# Patient Record
Sex: Male | Born: 1950 | Race: White | Hispanic: No | Marital: Married | Smoking: Current some day smoker
Health system: Southern US, Community
[De-identification: ages and names within clinical notes are randomized; demographics above are authoritative.]

## PROBLEM LIST (undated history)

## (undated) DIAGNOSIS — I499 Cardiac arrhythmia, unspecified: Secondary | ICD-10-CM

## (undated) DIAGNOSIS — F32A Depression, unspecified: Secondary | ICD-10-CM

## (undated) DIAGNOSIS — Z87442 Personal history of urinary calculi: Secondary | ICD-10-CM

## (undated) DIAGNOSIS — I10 Essential (primary) hypertension: Secondary | ICD-10-CM

## (undated) DIAGNOSIS — F329 Major depressive disorder, single episode, unspecified: Secondary | ICD-10-CM

## (undated) HISTORY — PX: JOINT REPLACEMENT: SHX530

## (undated) HISTORY — PX: TONSILLECTOMY: SUR1361

## (undated) HISTORY — DX: Cardiac arrhythmia, unspecified: I49.9

## (undated) HISTORY — DX: Essential (primary) hypertension: I10

## (undated) HISTORY — PX: HERNIA REPAIR: SHX51

---
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
## 2004-01-10 ENCOUNTER — Ambulatory Visit (HOSPITAL_COMMUNITY): Admission: RE | Admit: 2004-01-10 | Discharge: 2004-01-10 | Payer: Self-pay | Admitting: Gastroenterology

## 2004-01-10 ENCOUNTER — Encounter (INDEPENDENT_AMBULATORY_CARE_PROVIDER_SITE_OTHER): Payer: Self-pay | Admitting: *Deleted

## 2004-05-29 ENCOUNTER — Emergency Department (HOSPITAL_COMMUNITY): Admission: EM | Admit: 2004-05-29 | Discharge: 2004-05-29 | Payer: Self-pay | Admitting: Emergency Medicine

## 2009-04-22 ENCOUNTER — Encounter: Admission: RE | Admit: 2009-04-22 | Discharge: 2009-04-22 | Payer: Self-pay | Admitting: Family Medicine

## 2010-07-31 NOTE — Op Note (Signed)
Daniel Lucero, Daniel Lucero           ACCOUNT NO.:  [id number]   MEDICAL RECORD NO.:  [id number]          PATIENT TYPE:  AMB   LOCATION:  ENDO                         FACILITY:  [facility]   PHYSICIAN:  Anselmo Rod, M.D.  DATE OF BIRTH:  Sep 06, 1950   DATE OF PROCEDURE:  01/10/2004  DATE OF DISCHARGE:                                 OPERATIVE REPORT   PROCEDURE PERFORMED:  Screening colonoscopy.   ENDOSCOPIST:  Anselmo Rod, M.D.   INSTRUMENT USED:  Olympus video colonoscope.   INDICATION FOR PROCEDURE:  A 60 year old white male undergoing a screening  colonoscopy.  Rule out colonic polyps, masses, etc.   PREPROCEDURE PREPARATION:  Informed consent was procured from the patient.  The patient fasted for eight hours prior to the procedure and prepped with a  bottle of magnesium citrate and a gallon of GoLYTELY the night prior to the  procedure.   PREPROCEDURE PHYSICAL:  VITAL SIGNS:  The patient had stable vital signs.  NECK:  Supple.  CHEST:  Clear to auscultation.  S1, S2 regular.  ABDOMEN:  Soft with normal bowel sounds.   DESCRIPTION OF PROCEDURE:  The patient was placed in the left lateral  decubitus position and sedated with 75 mg of Demerol and 7.5 mg of Versed in  slow incremental doses.  Once the patient was adequately sedate and  maintained on low-flow oxygen and continuous cardiac monitoring, the Olympus  video colonoscope was advanced from the rectum to the cecum.  The  appendiceal orifice and the ileocecal valve were clearly visualized and  photographed.  No ulcerations or erosions were seen.  A polypoid lesion was  biopsied at 30 cm.  This was a yellowish-appearing lesion and may very well  be a lipoma.  Sigmoid diverticulosis was noted.  These diverticula were in  the early stages of formation.  Retroflexion in the rectum revealed small  internal hemorrhoids.  The patient tolerated the procedure well without  immediate complications.   IMPRESSION:  1.  Small  internal hemorrhoids.  2.  Polypoid lesion biopsied at 30 cm, question lipoma.  3.  Early sigmoid diverticulosis.  4.  Normal-appearing transverse colon, right colon, and cecum.   RECOMMENDATIONS:  1.  Await pathology results.  2.  Avoid all nonsteroidals, including aspirin, for the next four weeks.  3.  Brochures on diverticulosis given to the patient for his education.  4.  Outpatient follow-up in the next two weeks for further recommendations.       JNM/MEDQ  D:  01/10/2004  T:  01/10/2004  Job:  [id number]   cc:   Teena Irani. Arlyce Dice, M.D.  [address]  [place]  [place] [address]  Fax: [phone]

## 2014-03-11 ENCOUNTER — Other Ambulatory Visit (HOSPITAL_COMMUNITY): Payer: Self-pay | Admitting: Urology

## 2014-03-11 DIAGNOSIS — R972 Elevated prostate specific antigen [PSA]: Secondary | ICD-10-CM

## 2014-03-27 ENCOUNTER — Ambulatory Visit (HOSPITAL_COMMUNITY)
Admission: RE | Admit: 2014-03-27 | Discharge: 2014-03-27 | Disposition: A | Discharge status: Home / Self Care | Payer: 59 | Source: Ambulatory Visit | Attending: Urology | Admitting: Urology

## 2014-03-27 DIAGNOSIS — R972 Elevated prostate specific antigen [PSA]: Secondary | ICD-10-CM | POA: Diagnosis not present

## 2014-03-27 LAB — POCT I-STAT CREATININE: CREATININE: 1.1 mg/dL (ref 0.50–1.35)

## 2014-03-27 MED ORDER — GADOBENATE DIMEGLUMINE 529 MG/ML IV SOLN
20.0000 mL | Freq: Once | INTRAVENOUS | Status: AC | PRN
  Administered 2014-03-27: 17 mL via INTRAVENOUS

## 2015-05-21 DIAGNOSIS — Z87891 Personal history of nicotine dependence: Secondary | ICD-10-CM | POA: Insufficient documentation

## 2015-05-21 DIAGNOSIS — R972 Elevated prostate specific antigen [PSA]: Secondary | ICD-10-CM | POA: Insufficient documentation

## 2015-05-21 DIAGNOSIS — K5790 Diverticulosis of intestine, part unspecified, without perforation or abscess without bleeding: Secondary | ICD-10-CM | POA: Insufficient documentation

## 2015-05-21 DIAGNOSIS — F339 Major depressive disorder, recurrent, unspecified: Secondary | ICD-10-CM | POA: Insufficient documentation

## 2015-05-21 DIAGNOSIS — K5900 Constipation, unspecified: Secondary | ICD-10-CM | POA: Insufficient documentation

## 2015-05-21 DIAGNOSIS — M5412 Radiculopathy, cervical region: Secondary | ICD-10-CM | POA: Insufficient documentation

## 2015-05-21 DIAGNOSIS — F39 Unspecified mood [affective] disorder: Secondary | ICD-10-CM

## 2015-05-21 DIAGNOSIS — N400 Enlarged prostate without lower urinary tract symptoms: Secondary | ICD-10-CM | POA: Insufficient documentation

## 2015-05-21 DIAGNOSIS — M2540 Effusion, unspecified joint: Secondary | ICD-10-CM | POA: Insufficient documentation

## 2015-05-21 DIAGNOSIS — F3181 Bipolar II disorder: Secondary | ICD-10-CM | POA: Insufficient documentation

## 2015-05-21 DIAGNOSIS — F340 Cyclothymic disorder: Secondary | ICD-10-CM | POA: Insufficient documentation

## 2015-05-21 DIAGNOSIS — I499 Cardiac arrhythmia, unspecified: Secondary | ICD-10-CM | POA: Insufficient documentation

## 2015-09-09 DIAGNOSIS — M25531 Pain in right wrist: Secondary | ICD-10-CM | POA: Insufficient documentation

## 2015-09-09 DIAGNOSIS — M25532 Pain in left wrist: Secondary | ICD-10-CM

## 2016-12-03 ENCOUNTER — Other Ambulatory Visit: Payer: Self-pay | Admitting: Family Medicine

## 2016-12-03 ENCOUNTER — Ambulatory Visit
Admission: RE | Admit: 2016-12-03 | Discharge: 2016-12-03 | Disposition: A | Discharge status: Home / Self Care | Payer: 59 | Source: Ambulatory Visit | Attending: Family Medicine | Admitting: Family Medicine

## 2016-12-03 DIAGNOSIS — M5441 Lumbago with sciatica, right side: Secondary | ICD-10-CM

## 2016-12-03 DIAGNOSIS — M5442 Lumbago with sciatica, left side: Principal | ICD-10-CM

## 2016-12-15 DIAGNOSIS — M150 Primary generalized (osteo)arthritis: Secondary | ICD-10-CM

## 2016-12-15 DIAGNOSIS — M159 Polyosteoarthritis, unspecified: Secondary | ICD-10-CM | POA: Insufficient documentation

## 2016-12-15 DIAGNOSIS — Z9189 Other specified personal risk factors, not elsewhere classified: Secondary | ICD-10-CM | POA: Insufficient documentation

## 2017-05-05 DIAGNOSIS — G8929 Other chronic pain: Secondary | ICD-10-CM | POA: Insufficient documentation

## 2017-05-05 DIAGNOSIS — C4441 Basal cell carcinoma of skin of scalp and neck: Secondary | ICD-10-CM | POA: Insufficient documentation

## 2017-05-05 DIAGNOSIS — M25512 Pain in left shoulder: Secondary | ICD-10-CM

## 2017-09-27 ENCOUNTER — Other Ambulatory Visit: Payer: Self-pay | Admitting: Orthopedic Surgery

## 2017-09-27 DIAGNOSIS — M542 Cervicalgia: Secondary | ICD-10-CM

## 2017-09-27 DIAGNOSIS — M25512 Pain in left shoulder: Secondary | ICD-10-CM

## 2017-10-07 ENCOUNTER — Ambulatory Visit
Admission: RE | Admit: 2017-10-07 | Discharge: 2017-10-07 | Disposition: A | Discharge status: Home / Self Care | Payer: 59 | Source: Ambulatory Visit | Attending: Orthopedic Surgery | Admitting: Orthopedic Surgery

## 2017-10-07 DIAGNOSIS — M542 Cervicalgia: Secondary | ICD-10-CM

## 2017-10-07 DIAGNOSIS — M25512 Pain in left shoulder: Secondary | ICD-10-CM

## 2017-10-18 DIAGNOSIS — M5412 Radiculopathy, cervical region: Secondary | ICD-10-CM | POA: Insufficient documentation

## 2017-10-22 ENCOUNTER — Other Ambulatory Visit: Payer: Self-pay

## 2017-10-22 ENCOUNTER — Encounter (HOSPITAL_COMMUNITY): Payer: Self-pay

## 2017-10-22 ENCOUNTER — Emergency Department (HOSPITAL_COMMUNITY): Payer: 59

## 2017-10-22 ENCOUNTER — Emergency Department (HOSPITAL_COMMUNITY)
Admission: EM | Admit: 2017-10-22 | Discharge: 2017-10-22 | Disposition: A | Discharge status: Home / Self Care | Payer: 59 | Attending: Emergency Medicine | Admitting: Emergency Medicine

## 2017-10-22 DIAGNOSIS — Y999 Unspecified external cause status: Secondary | ICD-10-CM | POA: Diagnosis not present

## 2017-10-22 DIAGNOSIS — S161XXA Strain of muscle, fascia and tendon at neck level, initial encounter: Secondary | ICD-10-CM | POA: Diagnosis not present

## 2017-10-22 DIAGNOSIS — Y939 Activity, unspecified: Secondary | ICD-10-CM | POA: Diagnosis not present

## 2017-10-22 DIAGNOSIS — Z79899 Other long term (current) drug therapy: Secondary | ICD-10-CM | POA: Diagnosis not present

## 2017-10-22 DIAGNOSIS — S199XXA Unspecified injury of neck, initial encounter: Secondary | ICD-10-CM | POA: Diagnosis present

## 2017-10-22 DIAGNOSIS — Z7982 Long term (current) use of aspirin: Secondary | ICD-10-CM | POA: Insufficient documentation

## 2017-10-22 DIAGNOSIS — Z87891 Personal history of nicotine dependence: Secondary | ICD-10-CM | POA: Insufficient documentation

## 2017-10-22 DIAGNOSIS — Y92410 Unspecified street and highway as the place of occurrence of the external cause: Secondary | ICD-10-CM | POA: Insufficient documentation

## 2017-10-22 MED ORDER — CYCLOBENZAPRINE HCL 10 MG PO TABS: 10.0000 mg | ORAL_TABLET | Freq: Two times a day (BID) | ORAL | 0 refills | Status: DC | PRN
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
## 2017-10-22 NOTE — ED Notes (Signed)
Patient transported to CT 

## 2017-10-22 NOTE — ED Notes (Signed)
MD at bedside. 

## 2017-10-22 NOTE — ED Notes (Signed)
Pt updated on plan of care

## 2017-10-22 NOTE — ED Notes (Signed)
Per Ralene Bathe MD: C-collar may be removed.
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
## 2017-11-17 DIAGNOSIS — M25561 Pain in right knee: Secondary | ICD-10-CM | POA: Insufficient documentation

## 2017-12-16 DIAGNOSIS — M542 Cervicalgia: Secondary | ICD-10-CM | POA: Insufficient documentation

## 2018-01-12 DIAGNOSIS — M5030 Other cervical disc degeneration, unspecified cervical region: Secondary | ICD-10-CM | POA: Insufficient documentation

## 2018-02-27 ENCOUNTER — Other Ambulatory Visit: Payer: Self-pay

## 2018-02-27 ENCOUNTER — Ambulatory Visit (INDEPENDENT_AMBULATORY_CARE_PROVIDER_SITE_OTHER): Payer: 59 | Admitting: Family Medicine

## 2018-02-27 ENCOUNTER — Encounter: Payer: Self-pay | Admitting: Family Medicine

## 2018-02-27 VITALS — BP 140/96 | HR 51 | Temp 98.4°F | Resp 17 | Ht 69.0 in | Wt 179.2 lb

## 2018-02-27 DIAGNOSIS — R6889 Other general symptoms and signs: Secondary | ICD-10-CM

## 2018-02-27 DIAGNOSIS — I10 Essential (primary) hypertension: Secondary | ICD-10-CM | POA: Diagnosis not present

## 2018-02-27 MED ORDER — CETIRIZINE HCL 10 MG PO TABS: 10.0000 mg | ORAL_TABLET | Freq: Every day | ORAL | 11 refills | Status: DC

## 2018-02-27 MED ORDER — OLOPATADINE HCL 0.2 % OP SOLN: 1.0000 [drp] | Freq: Every day | OPHTHALMIC | 0 refills | Status: DC

## 2018-02-27 NOTE — Patient Instructions (Addendum)
° ° ° °  If you have lab work done today you will be contacted with your lab results within the next 2 weeks.  If you have not heard from us then please contact us. The fastest way to get your results is to register for My Chart. ° ° °IF you received an x-ray today, you will receive an invoice from [facility]. Please contact [facility] at [phone] with questions or concerns regarding your invoice.  ° °IF you received labwork today, you will receive an invoice from [facility]. Please contact [facility] at [phone] with questions or concerns regarding your invoice.  ° °Our billing staff will not be able to assist you with questions regarding bills from these companies. ° °You will be contacted with the lab results as soon as they are available. The fastest way to get your results is to activate your My Chart account. Instructions are located on the last page of this paperwork. If you have not heard from us regarding the results in 2 weeks, please contact this office. °  ° ° ° °

## 2018-02-27 NOTE — Progress Notes (Signed)
New Patient Office Visit  Subjective:  Patient ID: Daniel Lucero, male    DOB: March 23, 1950  Age: 67 y.o. MRN: [id number]  CC:  Chief Complaint  Patient presents with  . itchy eyes x Saturday night, some swelling and very very itc    tried cvs eye allergy relief and helps some and has used a couple times in the left eye    HPI Daniel Lucero [facility] presents for   Itching of the eyes that started late Saturday night at a family gathering and started with the left eye and is now itching in the right eye He states that he used some cvs otc eye drops which burned a little and did not help No drainage from the eye States that the eye is tender to the touch He states that his vision seems slightly blurry due to watery eyes He took a gabapentin related to his neck pain and sees [facility]  Hypertension He states that he always has high blood pressure at the doctors office He states it is related to nerves BP Readings from Last 3 Encounters:  02/27/18 (!) 140/96  10/22/17 (!) 163/92   He reports that he eats well and smokes an occasional cigar Denies nsaid use   No past medical history on file.  Past Surgical History:  Procedure Laterality Date  . HERNIA REPAIR    . TONSILLECTOMY      Family History  Problem Relation Age of Onset  . Alzheimer's disease Mother   . Hypertension Father   . Heart disease Father     Social History   Socioeconomic History  . Marital status: Married    Spouse name: Not on file  . Number of children: Not on file  . Years of education: Not on file  . Highest education level: Not on file  Occupational History  . Not on file  Social Needs  . Financial resource strain: Not on file  . Food insecurity:    Worry: Not on file    Inability: Not on file  . Transportation needs:    Medical: Not on file    Non-medical: Not on file  Tobacco Use  . Smoking status: Former Smoker    Types: Cigarettes  . Smokeless tobacco: Never Used    Substance and Sexual Activity  . Alcohol use: Yes    Alcohol/week: 5.0 standard drinks    Types: 5 Cans of beer per week  . Drug use: Never  . Sexual activity: Not on file  Lifestyle  . Physical activity:    Days per week: Not on file    Minutes per session: Not on file  . Stress: Not on file  Relationships  . Social connections:    Talks on phone: Not on file    Gets together: Not on file    Attends religious service: Not on file    Active member of club or organization: Not on file    Attends meetings of clubs or organizations: Not on file    Relationship status: Not on file  . Intimate partner violence:    Fear of current or ex partner: Not on file    Emotionally abused: Not on file    Physically abused: Not on file    Forced sexual activity: Not on file  Other Topics Concern  . Not on file  Social History Narrative  . Not on file    ROS Review of Systems Review of Systems  Constitutional: Negative for  activity change, appetite change, chills and fever.  HENT: Negative for congestion, nosebleeds, trouble swallowing and voice change.   Respiratory: Negative for cough, shortness of breath and wheezing.   Gastrointestinal: Negative for diarrhea, nausea and vomiting.  Genitourinary: Negative for difficulty urinating, dysuria, flank pain and hematuria.  Musculoskeletal: Negative for back pain, joint swelling and neck pain.  Neurological: Negative for dizziness, speech difficulty, light-headedness and numbness.  See HPI. All other review of systems negative.   Objective:   Today's Vitals: BP (!) 140/96   Pulse (!) 51   Temp 98.4 F (36.9 C) (Oral)   Resp 17   Ht 5\' 9"  (1.753 m)   Wt 179 lb 3.2 oz (81.3 kg)   SpO2 100%   BMI 26.46 kg/m   Physical Exam  General: alert, oriented, in NAD Head: normocephalic, atraumatic, no sinus tenderness Eyes: EOM intact, no scleral icterus or conjunctival injection Ears: TM clear bilaterally Nose: mucosa nonerythematous,  nonedematous Throat: no pharyngeal exudate or erythema Lymph: no posterior auricular, submental or cervical lymph adenopathy Heart: normal rate, normal sinus rhythm, no murmurs Lungs: clear to auscultation bilaterally, no wheezing   Assessment & Plan:   Problem List Items Addressed This Visit    None    Visit Diagnoses    Itchy eyes    -  Primary -  Likely allergic No conjunctivitis    Relevant Medications   Olopatadine HCl (PATADAY) 0.2 % SOLN   Essential hypertension     -  Newly diagnosed -  Discussed DASH diet -  Advised pt to follow up in 3 months for full physical and labs      Outpatient Encounter Medications as of 02/27/2018  Medication Sig  . gabapentin (NEURONTIN) 100 MG capsule Take 100 mg by mouth 3 (three) times a week.  . tamsulosin (FLOMAX) 0.4 MG CAPS capsule Take 0.4 mg by mouth at bedtime.  Marland Kitchen aspirin 325 MG EC tablet Take 325 mg by mouth daily as needed (headache).   . cetirizine (ZYRTEC) 10 MG tablet Take 1 tablet (10 mg total) by mouth daily.  . cyclobenzaprine (FLEXERIL) 10 MG tablet Take 1 tablet (10 mg total) by mouth 2 (two) times daily as needed for muscle spasms. (Patient not taking: Reported on 02/27/2018)  . Olopatadine HCl (PATADAY) 0.2 % SOLN Apply 1 drop to eye daily.   No facility-administered encounter medications on file as of 02/27/2018.     Follow-up: Return in about 3 months (around 05/29/2018) for physical exam .   Forrest Moron, MD

## 2018-03-16 DIAGNOSIS — M542 Cervicalgia: Secondary | ICD-10-CM | POA: Diagnosis not present

## 2018-03-20 DIAGNOSIS — M25562 Pain in left knee: Secondary | ICD-10-CM | POA: Diagnosis not present

## 2018-03-20 DIAGNOSIS — M25561 Pain in right knee: Secondary | ICD-10-CM | POA: Diagnosis not present

## 2018-03-24 DIAGNOSIS — M542 Cervicalgia: Secondary | ICD-10-CM | POA: Diagnosis not present

## 2018-03-28 DIAGNOSIS — M5030 Other cervical disc degeneration, unspecified cervical region: Secondary | ICD-10-CM | POA: Diagnosis not present

## 2018-03-31 DIAGNOSIS — M542 Cervicalgia: Secondary | ICD-10-CM | POA: Diagnosis not present

## 2018-04-03 DIAGNOSIS — M5030 Other cervical disc degeneration, unspecified cervical region: Secondary | ICD-10-CM | POA: Diagnosis not present

## 2018-04-03 DIAGNOSIS — M542 Cervicalgia: Secondary | ICD-10-CM | POA: Diagnosis not present

## 2018-04-13 ENCOUNTER — Other Ambulatory Visit: Payer: Self-pay

## 2018-04-13 ENCOUNTER — Encounter: Payer: Self-pay | Admitting: Family Medicine

## 2018-04-13 ENCOUNTER — Ambulatory Visit: Payer: BLUE CROSS/BLUE SHIELD | Admitting: Family Medicine

## 2018-04-13 VITALS — BP 140/90 | HR 60 | Temp 98.6°F | Ht 69.0 in | Wt 178.0 lb

## 2018-04-13 DIAGNOSIS — R21 Rash and other nonspecific skin eruption: Secondary | ICD-10-CM

## 2018-04-13 DIAGNOSIS — M542 Cervicalgia: Secondary | ICD-10-CM | POA: Diagnosis not present

## 2018-04-13 MED ORDER — TRIAMCINOLONE ACETONIDE 0.1 % EX CREA: 1.0000 "application " | TOPICAL_CREAM | Freq: Two times a day (BID) | CUTANEOUS | 0 refills | Status: DC

## 2018-04-13 MED ORDER — HYDROXYZINE HCL 25 MG PO TABS: 25.0000 mg | ORAL_TABLET | Freq: Every evening | ORAL | 0 refills | Status: DC | PRN

## 2018-04-13 NOTE — Patient Instructions (Signed)
° ° ° °  If you have lab work done today you will be contacted with your lab results within the next 2 weeks.  If you have not heard from us then please contact us. The fastest way to get your results is to register for My Chart. ° ° °IF you received an x-ray today, you will receive an invoice from [facility]. Please contact [facility] at [phone] with questions or concerns regarding your invoice.  ° °IF you received labwork today, you will receive an invoice from [facility]. Please contact [facility] at [phone] with questions or concerns regarding your invoice.  ° °Our billing staff will not be able to assist you with questions regarding bills from these companies. ° °You will be contacted with the lab results as soon as they are available. The fastest way to get your results is to activate your My Chart account. Instructions are located on the last page of this paperwork. If you have not heard from us regarding the results in 2 weeks, please contact this office. °  ° ° ° °
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
## 2018-04-25 DIAGNOSIS — M5030 Other cervical disc degeneration, unspecified cervical region: Secondary | ICD-10-CM | POA: Diagnosis not present

## 2018-04-28 DIAGNOSIS — R351 Nocturia: Secondary | ICD-10-CM | POA: Diagnosis not present

## 2018-04-28 DIAGNOSIS — N401 Enlarged prostate with lower urinary tract symptoms: Secondary | ICD-10-CM | POA: Diagnosis not present

## 2018-05-01 DIAGNOSIS — L570 Actinic keratosis: Secondary | ICD-10-CM | POA: Diagnosis not present

## 2018-05-01 DIAGNOSIS — D229 Melanocytic nevi, unspecified: Secondary | ICD-10-CM | POA: Diagnosis not present

## 2018-05-01 DIAGNOSIS — M542 Cervicalgia: Secondary | ICD-10-CM | POA: Diagnosis not present

## 2018-05-01 DIAGNOSIS — L299 Pruritus, unspecified: Secondary | ICD-10-CM | POA: Diagnosis not present

## 2018-05-16 DIAGNOSIS — M542 Cervicalgia: Secondary | ICD-10-CM | POA: Diagnosis not present

## 2018-05-16 DIAGNOSIS — R972 Elevated prostate specific antigen [PSA]: Secondary | ICD-10-CM | POA: Diagnosis not present

## 2018-05-16 DIAGNOSIS — N401 Enlarged prostate with lower urinary tract symptoms: Secondary | ICD-10-CM | POA: Diagnosis not present

## 2018-05-16 DIAGNOSIS — R351 Nocturia: Secondary | ICD-10-CM | POA: Diagnosis not present

## 2018-05-16 DIAGNOSIS — N5201 Erectile dysfunction due to arterial insufficiency: Secondary | ICD-10-CM | POA: Diagnosis not present

## 2018-06-03 ENCOUNTER — Other Ambulatory Visit: Payer: Self-pay | Admitting: *Deleted

## 2018-06-03 DIAGNOSIS — Z0000 Encounter for general adult medical examination without abnormal findings: Secondary | ICD-10-CM

## 2018-06-07 ENCOUNTER — Encounter: Payer: 59 | Admitting: Family Medicine

## 2018-07-13 ENCOUNTER — Ambulatory Visit: Payer: BLUE CROSS/BLUE SHIELD | Admitting: Family Medicine

## 2018-08-14 ENCOUNTER — Ambulatory Visit: Payer: BLUE CROSS/BLUE SHIELD | Admitting: Family Medicine

## 2018-09-25 DIAGNOSIS — Z1210 Encounter for screening for malignant neoplasm of intestinal tract, unspecified: Secondary | ICD-10-CM | POA: Diagnosis not present

## 2018-09-28 ENCOUNTER — Encounter: Payer: BLUE CROSS/BLUE SHIELD | Admitting: Family Medicine

## 2018-09-29 ENCOUNTER — Encounter: Payer: BLUE CROSS/BLUE SHIELD | Admitting: Family Medicine

## 2018-10-05 DIAGNOSIS — M5030 Other cervical disc degeneration, unspecified cervical region: Secondary | ICD-10-CM | POA: Diagnosis not present

## 2018-10-05 DIAGNOSIS — M25512 Pain in left shoulder: Secondary | ICD-10-CM | POA: Diagnosis not present

## 2018-10-23 DIAGNOSIS — N401 Enlarged prostate with lower urinary tract symptoms: Secondary | ICD-10-CM | POA: Diagnosis not present

## 2018-10-23 DIAGNOSIS — R351 Nocturia: Secondary | ICD-10-CM | POA: Diagnosis not present

## 2018-10-23 DIAGNOSIS — M542 Cervicalgia: Secondary | ICD-10-CM | POA: Diagnosis not present

## 2018-10-23 DIAGNOSIS — M25512 Pain in left shoulder: Secondary | ICD-10-CM | POA: Diagnosis not present

## 2018-10-31 DIAGNOSIS — M19012 Primary osteoarthritis, left shoulder: Secondary | ICD-10-CM | POA: Diagnosis not present

## 2018-10-31 DIAGNOSIS — R351 Nocturia: Secondary | ICD-10-CM | POA: Diagnosis not present

## 2018-10-31 DIAGNOSIS — R972 Elevated prostate specific antigen [PSA]: Secondary | ICD-10-CM | POA: Diagnosis not present

## 2018-10-31 DIAGNOSIS — M75102 Unspecified rotator cuff tear or rupture of left shoulder, not specified as traumatic: Secondary | ICD-10-CM | POA: Diagnosis not present

## 2018-10-31 DIAGNOSIS — N401 Enlarged prostate with lower urinary tract symptoms: Secondary | ICD-10-CM | POA: Diagnosis not present

## 2018-11-09 DIAGNOSIS — M13812 Other specified arthritis, left shoulder: Secondary | ICD-10-CM | POA: Diagnosis not present

## 2018-11-13 DIAGNOSIS — M542 Cervicalgia: Secondary | ICD-10-CM | POA: Diagnosis not present

## 2018-11-14 DIAGNOSIS — M5030 Other cervical disc degeneration, unspecified cervical region: Secondary | ICD-10-CM | POA: Diagnosis not present

## 2018-12-04 ENCOUNTER — Ambulatory Visit (INDEPENDENT_AMBULATORY_CARE_PROVIDER_SITE_OTHER): Payer: BC Managed Care – PPO | Admitting: Family Medicine

## 2018-12-04 ENCOUNTER — Other Ambulatory Visit: Payer: Self-pay

## 2018-12-04 DIAGNOSIS — Z23 Encounter for immunization: Secondary | ICD-10-CM | POA: Diagnosis not present

## 2018-12-04 NOTE — Patient Instructions (Signed)
Injection given, tolerated well.

## 2018-12-08 DIAGNOSIS — M13812 Other specified arthritis, left shoulder: Secondary | ICD-10-CM | POA: Diagnosis not present

## 2018-12-13 ENCOUNTER — Other Ambulatory Visit: Payer: Self-pay | Admitting: Orthopedic Surgery

## 2018-12-13 DIAGNOSIS — M13812 Other specified arthritis, left shoulder: Secondary | ICD-10-CM

## 2018-12-29 ENCOUNTER — Ambulatory Visit
Admission: RE | Admit: 2018-12-29 | Discharge: 2018-12-29 | Disposition: A | Discharge status: Home / Self Care | Payer: BC Managed Care – PPO | Source: Ambulatory Visit | Attending: Orthopedic Surgery | Admitting: Orthopedic Surgery

## 2018-12-29 DIAGNOSIS — M13812 Other specified arthritis, left shoulder: Secondary | ICD-10-CM

## 2018-12-29 DIAGNOSIS — M19012 Primary osteoarthritis, left shoulder: Secondary | ICD-10-CM | POA: Diagnosis not present

## 2018-12-29 DIAGNOSIS — Z01818 Encounter for other preprocedural examination: Secondary | ICD-10-CM | POA: Diagnosis not present

## 2019-01-23 ENCOUNTER — Encounter: Payer: Self-pay | Admitting: Emergency Medicine

## 2019-01-23 ENCOUNTER — Ambulatory Visit (INDEPENDENT_AMBULATORY_CARE_PROVIDER_SITE_OTHER): Payer: BC Managed Care – PPO | Admitting: Emergency Medicine

## 2019-01-23 ENCOUNTER — Other Ambulatory Visit: Payer: Self-pay

## 2019-01-23 ENCOUNTER — Ambulatory Visit (INDEPENDENT_AMBULATORY_CARE_PROVIDER_SITE_OTHER): Payer: BC Managed Care – PPO

## 2019-01-23 VITALS — BP 161/92 | HR 68 | Temp 97.9°F | Resp 16 | Ht 69.0 in | Wt 179.0 lb

## 2019-01-23 DIAGNOSIS — S67192A Crushing injury of right middle finger, initial encounter: Secondary | ICD-10-CM

## 2019-01-23 DIAGNOSIS — M79644 Pain in right finger(s): Secondary | ICD-10-CM | POA: Diagnosis not present

## 2019-01-23 DIAGNOSIS — S60131A Contusion of right middle finger with damage to nail, initial encounter: Secondary | ICD-10-CM | POA: Diagnosis not present

## 2019-01-23 DIAGNOSIS — S6710XA Crushing injury of unspecified finger(s), initial encounter: Secondary | ICD-10-CM

## 2019-01-23 DIAGNOSIS — S6010XA Contusion of unspecified finger with damage to nail, initial encounter: Secondary | ICD-10-CM

## 2019-01-23 NOTE — Progress Notes (Signed)
Daniel Lucero 68 y.o.   Chief Complaint  Patient presents with  . Finger Injury    RIGHT 3rd on Saturday - smashed it    HISTORY OF PRESENT ILLNESS: This is a 68 y.o. male status post crushing injury on his right middle finger 3 days ago complaining of throbbing pain.  HPI   Prior to Admission medications   Medication Sig Start Date End Date Taking? Authorizing Provider  aspirin 325 MG EC tablet Take 325 mg by mouth daily as needed (headache).    Yes [provider]  tamsulosin (FLOMAX) 0.4 MG CAPS capsule Take 0.4 mg by mouth at bedtime.   Yes [provider]  gabapentin (NEURONTIN) 100 MG capsule Take 100 mg by mouth 3 (three) times a week.    [provider]  hydrOXYzine (ATARAX/VISTARIL) 25 MG tablet Take 1-2 tablets (25-50 mg total) by mouth at bedtime as needed. Patient not taking: Reported on 01/23/2019 04/13/18   Rutherford Guys, MD  triamcinolone cream (KENALOG) 0.1 % Apply 1 application topically 2 (two) times daily. Patient not taking: Reported on 01/23/2019 04/13/18   Rutherford Guys, MD    Allergies  Allergen Reactions  . Penicillins Rash    Has patient had a PCN reaction causing immediate rash, facial/tongue/throat swelling, SOB or lightheadedness with hypotension: Yes Has patient had a PCN reaction causing severe rash involving mucus membranes or skin necrosis: No Has patient had a PCN reaction that required hospitalization: No Has patient had a PCN reaction occurring within the last 10 years: No If all of the above answers are "NO", then may proceed with Cephalosporin use.    Patient Active Problem List   Diagnosis Date Noted  . DDD (degenerative disc disease), cervical 01/12/2018  . Pain in right knee 11/17/2017  . Basal cell carcinoma (BCC) of left side of neck 05/05/2017  . Chronic left shoulder pain 05/05/2017  . 10 year risk of MI or stroke 7.5% or greater 12/15/2016  . Primary osteoarthritis involving multiple joints  12/15/2016  . Affective personality disorder 05/21/2015  . Benign enlargement of prostate 05/21/2015  . Bipolar II disorder ([place]) 05/21/2015  . Cardiac dysrhythmia 05/21/2015  . Constipation 05/21/2015  . Diverticulosis 05/21/2015  . Elevated PSA 05/21/2015  . History of tobacco use 05/21/2015  . Recurrent major depressive disorder ([place]) 05/21/2015    History reviewed. No pertinent past medical history.  Past Surgical History:  Procedure Laterality Date  . HERNIA REPAIR    . TONSILLECTOMY      Social History   Socioeconomic History  . Marital status: Married    Spouse name: Not on file  . Number of children: Not on file  . Years of education: Not on file  . Highest education level: Not on file  Occupational History  . Not on file  Social Needs  . Financial resource strain: Not on file  . Food insecurity    Worry: Not on file    Inability: Not on file  . Transportation needs    Medical: Not on file    Non-medical: Not on file  Tobacco Use  . Smoking status: Former Smoker    Types: Cigarettes  . Smokeless tobacco: Never Used  Substance and Sexual Activity  . Alcohol use: Yes    Alcohol/week: 5.0 standard drinks    Types: 5 Cans of beer per week  . Drug use: Never  . Sexual activity: Not on file  Lifestyle  . Physical activity  Days per week: Not on file    Minutes per session: Not on file  . Stress: Not on file  Relationships  . Social Herbalist on phone: Not on file    Gets together: Not on file    Attends religious service: Not on file    Active member of club or organization: Not on file    Attends meetings of clubs or organizations: Not on file    Relationship status: Not on file  . Intimate partner violence    Fear of current or ex partner: Not on file    Emotionally abused: Not on file    Physically abused: Not on file    Forced sexual activity: Not on file  Other Topics Concern  . Not on file  Social History Narrative  . Not on  file    Family History  Problem Relation Age of Onset  . Alzheimer's disease Mother   . Hypertension Father   . Heart disease Father      Review of Systems  Constitutional: Negative.  Negative for chills and fever.  HENT: Negative.  Negative for congestion and sore throat.   Respiratory: Negative.  Negative for cough and shortness of breath.   Cardiovascular: Negative.  Negative for chest pain and palpitations.  Gastrointestinal: Negative for abdominal pain, nausea and vomiting.  Genitourinary: Negative.   Musculoskeletal: Negative.  Negative for myalgias.  Skin: Negative.   Neurological: Negative.  Negative for dizziness and headaches.  All other systems reviewed and are negative.  Vitals:   01/23/19 1026  BP: (!) 161/92  Pulse: 68  Resp: 16  Temp: 97.9 F (36.6 C)  SpO2: 94%     Physical Exam Vitals signs reviewed.  Constitutional:      Appearance: Normal appearance.  HENT:     Head: Normocephalic.  Eyes:     Extraocular Movements: Extraocular movements intact.  Neck:     Musculoskeletal: Normal range of motion.  Cardiovascular:     Rate and Rhythm: Normal rate.  Pulmonary:     Effort: Pulmonary effort is normal.  Skin:    General: Skin is warm and dry.     Capillary Refill: Capillary refill takes less than 2 seconds.     Comments: Right middle finger: Positive subungual hematoma.  Positive swelling, erythema, and tenderness to distal finger.  Neurovascularly intact.  Neurological:     General: No focal deficit present.     Mental Status: He is alert and oriented to person, place, and time.  Psychiatric:        Mood and Affect: Mood normal.        Behavior: Behavior normal.    Dg Finger Middle Right  Result Date: 01/23/2019 CLINICAL DATA:  Crush injury EXAM: RIGHT MIDDLE FINGER 2+V COMPARISON:  None. FINDINGS: There is no evidence of fracture or dislocation. Joint spaces are preserved. No radiopaque foreign body. IMPRESSION: No acute fracture.  Electronically Signed   By: Macy Mis M.D.   On: 01/23/2019 10:53     ASSESSMENT & PLAN: Daniel Lucero was seen today for finger injury.  Diagnoses and all orders for this visit:  Crushing injury of finger, initial encounter -     DG Finger Middle Right; Future  Pain of finger of right hand  Subungual hematoma of digit of hand, initial encounter    Patient Instructions       If you have lab work done today you will be contacted with your lab results within  the next 2 weeks.  If you have not heard from [place] then please contact us. The fastest way to get your results is to register for My Chart.   IF you received an x-ray today, you will receive an invoice from [facility]. Please contact [facility] at [phone] with questions or concerns regarding your invoice.   IF you received labwork today, you will receive an invoice from [facility]. Please contact [facility] at [phone] with questions or concerns regarding your invoice.   Our billing staff will not be able to assist you with questions regarding bills from these companies.  You will be contacted with the lab results as soon as they are available. The fastest way to get your results is to activate your My Chart account. Instructions are located on the last page of this paperwork. If you have not heard from [place] regarding the results in 2 weeks, please contact this office.     Subungual Hematoma A subungual hematoma is a collection of blood under a fingernail or toenail. It can cause pain and a blue area under the nail. What are the causes? This condition is caused by an injury to a finger or toe that breaks a blood vessel under the nail. It can result from:  A hard, direct hit to a finger or toe (crush injury).  Pressure being put on a finger or toe over and over again, such as pressure on a toe from running. What are the signs or symptoms?   A blue or dark blue color under the nail.  Pain or  throbbing in the injured area. How is this treated? Treatment is often not needed for this condition. The pain often goes away in a few days, and the dark color under the nail will go away as the nail grows. If treatment is needed, your doctor may:  Do a procedure to drain the blood from under the nail. This may be done if you have a lot of pain or if a lot of blood collects under the nail.  Remove the nail. This may be done if there is a cut under the nail that needs stitches (sutures). Follow these instructions at home: Managing pain, stiffness, and swelling   If told, put ice on the area. ? Put ice in a plastic bag. ? Place a towel between your skin and the bag. ? Leave the ice on for 20 minutes, 2-3 times a day.  Raise (elevate) the injured finger or toe above the level of your heart while you are sitting or lying down. Doing this will help with pain and swelling. Injury care  Follow instructions from your doctor about how to take care of your injury. Make sure you: ? Change any bandage (dressing) as told by your doctor. ? Wash your hands with soap and water before you change your bandage. If you cannot use soap and water, use hand sanitizer. ? Leave stitches (sutures) in place. You may have these if your doctor fixed a cut under the nail. The stitches may need to stay in place for 2 weeks or longer.  If part of your nail falls off, gently trim the rest of the nail. General instructions  Take over-the-counter and prescription medicines only as told by your doctor.  Return to your normal activities as told by your doctor. Ask your doctor what activities are safe for you.  Keep all follow-up visits as told by your doctor. This is important. Contact a doctor if you have:  Pain that is not helped by medicine.  A fever.  Redness, swelling, or pain around your nail. Get help right away if you have:  Fluid, blood, or pus coming from your nail. Summary  A subungual hematoma  is a collection of blood under a fingernail or toenail.  It can cause pain and a blue area under the nail.  Treatment is often not needed for this condition.  Raise the injured finger or toe above the level of your heart while you are sitting or lying down. This information is not intended to replace advice given to you by your health care provider. Make sure you discuss any questions you have with your health care provider. Document Released: 05/24/2011 Document Revised: 08/04/2017 Document Reviewed: 08/04/2017 Elsevier Patient Education  2020 Elsevier Inc.      Agustina Caroli, MD [facility]

## 2019-01-23 NOTE — Patient Instructions (Addendum)
If you have lab work done today you will be contacted with your lab results within the next 2 weeks.  If you have not heard from [place] then please contact us. The fastest way to get your results is to register for My Chart.   IF you received an x-ray today, you will receive an invoice from [facility]. Please contact [facility] at [phone] with questions or concerns regarding your invoice.   IF you received labwork today, you will receive an invoice from [facility]. Please contact [facility] at [phone] with questions or concerns regarding your invoice.   Our billing staff will not be able to assist you with questions regarding bills from these companies.  You will be contacted with the lab results as soon as they are available. The fastest way to get your results is to activate your My Chart account. Instructions are located on the last page of this paperwork. If you have not heard from [place] regarding the results in 2 weeks, please contact this office.     Subungual Hematoma A subungual hematoma is a collection of blood under a fingernail or toenail. It can cause pain and a blue area under the nail. What are the causes? This condition is caused by an injury to a finger or toe that breaks a blood vessel under the nail. It can result from:  A hard, direct hit to a finger or toe (crush injury).  Pressure being put on a finger or toe over and over again, such as pressure on a toe from running. What are the signs or symptoms?   A blue or dark blue color under the nail.  Pain or throbbing in the injured area. How is this treated? Treatment is often not needed for this condition. The pain often goes away in a few days, and the dark color under the nail will go away as the nail grows. If treatment is needed, your doctor may:  Do a procedure to drain the blood from under the nail. This may be done if you have a lot of pain or if a lot of blood collects under the  nail.  Remove the nail. This may be done if there is a cut under the nail that needs stitches (sutures). Follow these instructions at home: Managing pain, stiffness, and swelling   If told, put ice on the area. ? Put ice in a plastic bag. ? Place a towel between your skin and the bag. ? Leave the ice on for 20 minutes, 2-3 times a day.  Raise (elevate) the injured finger or toe above the level of your heart while you are sitting or lying down. Doing this will help with pain and swelling. Injury care  Follow instructions from your doctor about how to take care of your injury. Make sure you: ? Change any bandage (dressing) as told by your doctor. ? Wash your hands with soap and water before you change your bandage. If you cannot use soap and water, use hand sanitizer. ? Leave stitches (sutures) in place. You may have these if your doctor fixed a cut under the nail. The stitches may need to stay in place for 2 weeks or longer.  If part of your nail falls off, gently trim the rest of the nail. General instructions  Take over-the-counter and prescription medicines only as told by your doctor.  Return to your normal activities as told by your doctor. Ask your doctor what activities are safe for you.  Keep all follow-up visits as told by your doctor. This is important. Contact a doctor if you have:  Pain that is not helped by medicine.  A fever.  Redness, swelling, or pain around your nail. Get help right away if you have:  Fluid, blood, or pus coming from your nail. Summary  A subungual hematoma is a collection of blood under a fingernail or toenail.  It can cause pain and a blue area under the nail.  Treatment is often not needed for this condition.  Raise the injured finger or toe above the level of your heart while you are sitting or lying down. This information is not intended to replace advice given to you by your health care provider. Make sure you discuss any questions  you have with your health care provider. Document Released: 05/24/2011 Document Revised: 08/04/2017 Document Reviewed: 08/04/2017 Elsevier Patient Education  2020 [employer].

## 2019-01-24 ENCOUNTER — Ambulatory Visit: Payer: Medicare Other | Admitting: Family Medicine

## 2019-02-01 NOTE — Pre-Procedure Instructions (Signed)
[place] [place] [address] Phone: [phone] Fax: [phone]      Your procedure is scheduled on 02-06-19 Tuesday  Report to [facility] Main Entrance "A" at 1030A.M., and check in at the Admitting office.  Call this number if you have problems the morning of surgery:  [phone]  Call [phone] if you have any questions prior to your surgery date Monday-Friday 8am-4pm    Remember:  Do not eat after midnight the night before your surgery  You may drink clear liquids until 0930AM the morning of your surgery.   Clear liquids allowed are: Water, Non-Citrus Juices (without pulp), Carbonated Beverages, Clear Tea, Black Coffee Only, and Gatorade  Please complete your PRE-SURGERY ENSURE that was provided to you by 0930AM the morning of surgery.  Please, if able, drink it in one setting. DO NOT SIP.    Take these medicines the morning of surgery with A SIP OF WATER :none  Follow your surgeon's instructions on when to stop Aspirin.  If no instructions were given by your surgeon then you will need to call the office to get those instructions.     7 days prior to surgery STOP taking any Aspirin (unless otherwise instructed by your surgeon), Aleve, Naproxen, Ibuprofen, Motrin, Advil, Goody's, BC's, all herbal medications, fish oil, and all vitamins.    The Morning of Surgery  Do not wear jewelry, make-up or nail polish.  Do not wear lotions, powders, or perfumes/colognes, or deodorant             Men may shave face and neck.  Do not bring valuables to the hospital.  [facility] is not responsible for any belongings or valuables.  If you are a smoker, DO NOT Smoke 24 hours prior to surgery  If you wear a CPAP at night please bring your mask, tubing, and machine the morning of surgery   Remember that you must have someone to transport you home after your surgery, and remain with you for 24  hours if you are discharged the same day.   Please bring cases for contacts, glasses, hearing aids, dentures or bridgework because it cannot be worn into surgery.    Leave your suitcase in the car.  After surgery it may be brought to your room.  For patients admitted to the hospital, discharge time will be determined by your treatment team.  Patients discharged the day of surgery will not be allowed to drive home.    Special instructions:   [facility]- Preparing For Surgery  Before surgery, you can play an important role. Because skin is not sterile, your skin needs to be as free of germs as possible. You can reduce the number of germs on your skin by washing with CHG (chlorahexidine gluconate) Soap before surgery.  CHG is an antiseptic cleaner which kills germs and bonds with the skin to continue killing germs even after washing.    Oral Hygiene is also important to reduce your risk of infection.  Remember - BRUSH YOUR TEETH THE MORNING OF SURGERY WITH YOUR REGULAR TOOTHPASTE  Please do not use if you have an allergy to CHG or antibacterial soaps. If your skin becomes reddened/irritated stop using the CHG.  Do not shave (including legs and underarms) for at least 48 hours prior to first CHG shower. It is OK to shave your face.  Please follow these instructions carefully.   1. Shower the  NIGHT BEFORE SURGERY and the MORNING OF SURGERY with CHG Soap.   2. If you chose to wash your hair, wash your hair first as usual with your normal shampoo.  3. After you shampoo, rinse your hair and body thoroughly to remove the shampoo.  4. Use CHG as you would any other liquid soap. You can apply CHG directly to the skin and wash gently with a scrungie or a clean washcloth.   5. Apply the CHG Soap to your body ONLY FROM THE NECK DOWN.  Do not use on open wounds or open sores. Avoid contact with your eyes, ears, mouth and genitals (private parts). Wash Face and genitals (private parts)  with your  normal soap.   6. Wash thoroughly, paying special attention to the area where your surgery will be performed.  7. Thoroughly rinse your body with warm water from the neck down.  8. DO NOT shower/wash with your normal soap after using and rinsing off the CHG Soap.  9. Pat yourself dry with a CLEAN TOWEL.  10. Wear CLEAN PAJAMAS to bed the night before surgery, wear comfortable clothes the morning of surgery  11. Place CLEAN SHEETS on your bed the night of your first shower and DO NOT SLEEP WITH PETS.    Day of Surgery:  Please shower the morning of surgery with the CHG soap Do not apply any deodorants/lotions. Please wear clean clothes to the hospital/surgery center.   Remember to brush your teeth WITH YOUR REGULAR TOOTHPASTE.   Please read over the fact sheets that you were given.

## 2019-02-02 ENCOUNTER — Encounter (HOSPITAL_COMMUNITY)
Admission: RE | Admit: 2019-02-02 | Discharge: 2019-02-02 | Disposition: A | Discharge status: Home / Self Care | Payer: BC Managed Care – PPO | Source: Ambulatory Visit | Attending: Orthopedic Surgery | Admitting: Orthopedic Surgery

## 2019-02-02 ENCOUNTER — Other Ambulatory Visit: Payer: Self-pay

## 2019-02-02 ENCOUNTER — Encounter (HOSPITAL_COMMUNITY): Payer: Self-pay

## 2019-02-02 ENCOUNTER — Other Ambulatory Visit (HOSPITAL_COMMUNITY)
Admission: RE | Admit: 2019-02-02 | Discharge: 2019-02-02 | Disposition: A | Discharge status: Home / Self Care | Payer: BC Managed Care – PPO | Source: Ambulatory Visit | Attending: Orthopedic Surgery | Admitting: Orthopedic Surgery

## 2019-02-02 DIAGNOSIS — Z01812 Encounter for preprocedural laboratory examination: Secondary | ICD-10-CM | POA: Diagnosis not present

## 2019-02-02 DIAGNOSIS — Z20828 Contact with and (suspected) exposure to other viral communicable diseases: Secondary | ICD-10-CM | POA: Diagnosis not present

## 2019-02-02 HISTORY — DX: Personal history of urinary calculi: Z87.442

## 2019-02-02 HISTORY — DX: Depression, unspecified: F32.A

## 2019-02-02 LAB — CBC
HCT: 43.3 % (ref 39.0–52.0)
Hemoglobin: 15.1 g/dL (ref 13.0–17.0)
MCH: 31.8 pg (ref 26.0–34.0)
MCHC: 34.9 g/dL (ref 30.0–36.0)
MCV: 91.2 fL (ref 80.0–100.0)
Platelets: 211 10*3/uL (ref 150–400)
RBC: 4.75 MIL/uL (ref 4.22–5.81)
RDW: 11.9 % (ref 11.5–15.5)
WBC: 5.5 10*3/uL (ref 4.0–10.5)
nRBC: 0 % (ref 0.0–0.2)

## 2019-02-02 LAB — BASIC METABOLIC PANEL
Anion gap: 10 (ref 5–15)
BUN: 18 mg/dL (ref 8–23)
CO2: 26 mmol/L (ref 22–32)
Calcium: 9.4 mg/dL (ref 8.9–10.3)
Chloride: 104 mmol/L (ref 98–111)
Creatinine, Ser: 1.11 mg/dL (ref 0.61–1.24)
GFR calc Af Amer: >60 mL/min (ref >60)
GFR calc non Af Amer: >60 mL/min (ref >60)
Glucose, Bld: 90 mg/dL (ref 70–99)
Potassium: 4.1 mmol/L (ref 3.5–5.1)
Sodium: 140 mmol/L (ref 135–145)

## 2019-02-02 LAB — SURGICAL PCR SCREEN
MRSA, PCR: NEGATIVE
Staphylococcus aureus: NEGATIVE

## 2019-02-02 NOTE — Progress Notes (Addendum)
Covid test scheduled for today. No h/o symptoms concerning for Covid.No h/o recent travel/. No h/o recent exposure to Covid positive patients.  PCP - Dr Redmond Pulling, Idelle Crouch  Cardiologist -denies  Urology-Dr Alinda Money, Thalia Party   Chest x-ray - NA  EKG - NA  Stress Test - denies  ECHO - denies  Cardiac Cath - denies  AICD-denies PM-denies LOOP-denies  Sleep Study - denies CPAP - NA  LABS-PCR, CBC, BMP  ASA-325mg . Self medicating for pain as needed.  ERAS-Ensure, clear liquids   HA1C-denies Fasting Blood Sugar -  Checks Blood Sugar _____ times a day  Anesthesia-NA  Pt denies having chest pain, sob, or fever at this time. All instructions explained to the pt, with a verbal understanding of the material. Pt agrees to go over the instructions while at home for a better understanding. Pt also instructed to self quarantine after being tested for COVID-19. The opportunity to ask questions was provided.

## 2019-02-04 LAB — NOVEL CORONAVIRUS, NAA (HOSP ORDER, SEND-OUT TO REF LAB; TAT 18-24 HRS): SARS-CoV-2, NAA: NOT DETECTED

## 2019-02-06 ENCOUNTER — Encounter (HOSPITAL_COMMUNITY)
Admission: RE | Disposition: A | Discharge status: Home / Self Care | Payer: Self-pay | Source: Home / Self Care | Attending: Orthopedic Surgery

## 2019-02-06 ENCOUNTER — Inpatient Hospital Stay (HOSPITAL_COMMUNITY)
Admission: RE | Admit: 2019-02-06 | Discharge: 2019-02-07 | DRG: 483 | Disposition: A | Discharge status: Home / Self Care | Payer: BC Managed Care – PPO | Attending: Orthopedic Surgery | Admitting: Orthopedic Surgery

## 2019-02-06 ENCOUNTER — Inpatient Hospital Stay (HOSPITAL_COMMUNITY): Payer: BC Managed Care – PPO | Admitting: Certified Registered Nurse Anesthetist

## 2019-02-06 ENCOUNTER — Other Ambulatory Visit: Payer: Self-pay

## 2019-02-06 ENCOUNTER — Inpatient Hospital Stay (HOSPITAL_COMMUNITY): Payer: BC Managed Care – PPO

## 2019-02-06 DIAGNOSIS — Z20828 Contact with and (suspected) exposure to other viral communicable diseases: Secondary | ICD-10-CM | POA: Diagnosis present

## 2019-02-06 DIAGNOSIS — Z880 Allergy status to penicillin: Secondary | ICD-10-CM | POA: Diagnosis not present

## 2019-02-06 DIAGNOSIS — M25561 Pain in right knee: Secondary | ICD-10-CM | POA: Diagnosis not present

## 2019-02-06 DIAGNOSIS — M7552 Bursitis of left shoulder: Secondary | ICD-10-CM | POA: Diagnosis present

## 2019-02-06 DIAGNOSIS — Z8249 Family history of ischemic heart disease and other diseases of the circulatory system: Secondary | ICD-10-CM

## 2019-02-06 DIAGNOSIS — Z7982 Long term (current) use of aspirin: Secondary | ICD-10-CM | POA: Diagnosis not present

## 2019-02-06 DIAGNOSIS — F329 Major depressive disorder, single episode, unspecified: Secondary | ICD-10-CM | POA: Diagnosis present

## 2019-02-06 DIAGNOSIS — M19012 Primary osteoarthritis, left shoulder: Secondary | ICD-10-CM | POA: Diagnosis not present

## 2019-02-06 DIAGNOSIS — Z79899 Other long term (current) drug therapy: Secondary | ICD-10-CM

## 2019-02-06 DIAGNOSIS — Z820 Family history of epilepsy and other diseases of the nervous system: Secondary | ICD-10-CM

## 2019-02-06 DIAGNOSIS — M5030 Other cervical disc degeneration, unspecified cervical region: Secondary | ICD-10-CM | POA: Diagnosis not present

## 2019-02-06 DIAGNOSIS — Z87891 Personal history of nicotine dependence: Secondary | ICD-10-CM | POA: Diagnosis not present

## 2019-02-06 DIAGNOSIS — Z96612 Presence of left artificial shoulder joint: Secondary | ICD-10-CM | POA: Diagnosis not present

## 2019-02-06 DIAGNOSIS — Z471 Aftercare following joint replacement surgery: Secondary | ICD-10-CM | POA: Diagnosis not present

## 2019-02-06 DIAGNOSIS — Z87442 Personal history of urinary calculi: Secondary | ICD-10-CM

## 2019-02-06 DIAGNOSIS — G8918 Other acute postprocedural pain: Secondary | ICD-10-CM | POA: Diagnosis not present

## 2019-02-06 DIAGNOSIS — F3181 Bipolar II disorder: Secondary | ICD-10-CM | POA: Diagnosis not present

## 2019-02-06 HISTORY — PX: TOTAL SHOULDER ARTHROPLASTY: SHX126

## 2019-02-06 SURGERY — ARTHROPLASTY, SHOULDER, TOTAL
Anesthesia: General | Site: Shoulder | Laterality: Left

## 2019-02-06 MED ORDER — OXYCODONE HCL 5 MG PO TABS
ORAL_TABLET | ORAL | Status: AC
  Filled 2019-02-06: qty 1

## 2019-02-06 MED ORDER — PHENYLEPHRINE HCL (PRESSORS) 10 MG/ML IV SOLN
INTRAVENOUS | Status: AC
  Filled 2019-02-06: qty 1

## 2019-02-06 MED ORDER — ACETAMINOPHEN 500 MG PO TABS
500.0000 mg | ORAL_TABLET | Freq: Four times a day (QID) | ORAL | Status: DC
  Administered 2019-02-06 – 2019-02-07 (×3): 500 mg via ORAL
  Filled 2019-02-06 (×3): qty 1

## 2019-02-06 MED ORDER — BUPIVACAINE LIPOSOME 1.3 % IJ SUSP
INTRAMUSCULAR | Status: DC | PRN
  Administered 2019-02-06: 10 mL via PERINEURAL

## 2019-02-06 MED ORDER — CEFAZOLIN SODIUM-DEXTROSE 2-4 GM/100ML-% IV SOLN
2.0000 g | INTRAVENOUS | Status: AC
  Administered 2019-02-06: 13:00:00 2 g via INTRAVENOUS

## 2019-02-06 MED ORDER — FENTANYL CITRATE (PF) 100 MCG/2ML IJ SOLN
100.0000 ug | Freq: Once | INTRAMUSCULAR | Status: AC
  Administered 2019-02-06: 12:00:00 100 ug via INTRAVENOUS

## 2019-02-06 MED ORDER — FENTANYL CITRATE (PF) 100 MCG/2ML IJ SOLN: 25.0000 ug | INTRAMUSCULAR | Status: DC | PRN

## 2019-02-06 MED ORDER — ONDANSETRON HCL 4 MG/2ML IJ SOLN
INTRAMUSCULAR | Status: DC | PRN
  Administered 2019-02-06: 4 mg via INTRAVENOUS

## 2019-02-06 MED ORDER — PHENYLEPHRINE 40 MCG/ML (10ML) SYRINGE FOR IV PUSH (FOR BLOOD PRESSURE SUPPORT)
PREFILLED_SYRINGE | INTRAVENOUS | Status: AC
  Filled 2019-02-06: qty 10

## 2019-02-06 MED ORDER — MORPHINE SULFATE (PF) 2 MG/ML IV SOLN
INTRAVENOUS | Status: AC
  Administered 2019-02-06: 2 mg via INTRAVENOUS
  Filled 2019-02-06: qty 1

## 2019-02-06 MED ORDER — METOCLOPRAMIDE HCL 5 MG PO TABS: 5.0000 mg | ORAL_TABLET | Freq: Three times a day (TID) | ORAL | Status: DC | PRN

## 2019-02-06 MED ORDER — MEPERIDINE HCL 25 MG/ML IJ SOLN: 6.2500 mg | INTRAMUSCULAR | Status: DC | PRN

## 2019-02-06 MED ORDER — LACTATED RINGERS IV SOLN
INTRAVENOUS | Status: DC
  Administered 2019-02-06 (×2): via INTRAVENOUS

## 2019-02-06 MED ORDER — MIDAZOLAM HCL 2 MG/2ML IJ SOLN
INTRAMUSCULAR | Status: AC
  Filled 2019-02-06: qty 2

## 2019-02-06 MED ORDER — DEXAMETHASONE SODIUM PHOSPHATE 10 MG/ML IJ SOLN
INTRAMUSCULAR | Status: DC | PRN
  Administered 2019-02-06: 10 mg via INTRAVENOUS

## 2019-02-06 MED ORDER — ACETAMINOPHEN 160 MG/5ML PO SOLN: 325.0000 mg | ORAL | Status: DC | PRN

## 2019-02-06 MED ORDER — HYDROCODONE-ACETAMINOPHEN 7.5-325 MG PO TABS
1.0000 | ORAL_TABLET | ORAL | Status: DC | PRN
  Administered 2019-02-07 (×2): 1 via ORAL
  Administered 2019-02-07: 2 via ORAL
  Filled 2019-02-06 (×2): qty 1
  Filled 2019-02-06: qty 2

## 2019-02-06 MED ORDER — ONDANSETRON HCL 4 MG/2ML IJ SOLN: 4.0000 mg | Freq: Four times a day (QID) | INTRAMUSCULAR | Status: DC | PRN

## 2019-02-06 MED ORDER — DOCUSATE SODIUM 100 MG PO CAPS
100.0000 mg | ORAL_CAPSULE | Freq: Two times a day (BID) | ORAL | Status: DC
  Administered 2019-02-06 – 2019-02-07 (×2): 100 mg via ORAL
  Filled 2019-02-06 (×2): qty 1

## 2019-02-06 MED ORDER — LIDOCAINE 2% (20 MG/ML) 5 ML SYRINGE
INTRAMUSCULAR | Status: AC
  Filled 2019-02-06: qty 5

## 2019-02-06 MED ORDER — CHLORHEXIDINE GLUCONATE 4 % EX LIQD: 60.0000 mL | Freq: Once | CUTANEOUS | Status: DC

## 2019-02-06 MED ORDER — ROCURONIUM BROMIDE 50 MG/5ML IV SOSY
PREFILLED_SYRINGE | INTRAVENOUS | Status: DC | PRN
  Administered 2019-02-06: 20 mg via INTRAVENOUS
  Administered 2019-02-06: 60 mg via INTRAVENOUS
  Administered 2019-02-06: 10 mg via INTRAVENOUS

## 2019-02-06 MED ORDER — VANCOMYCIN HCL 1000 MG IV SOLR
INTRAVENOUS | Status: DC | PRN
  Administered 2019-02-06: 1000 mg via TOPICAL

## 2019-02-06 MED ORDER — ACETAMINOPHEN 325 MG PO TABS: 325.0000 mg | ORAL_TABLET | ORAL | Status: DC | PRN

## 2019-02-06 MED ORDER — VANCOMYCIN HCL 1000 MG IV SOLR
INTRAVENOUS | Status: AC
  Filled 2019-02-06: qty 1000

## 2019-02-06 MED ORDER — FENTANYL CITRATE (PF) 100 MCG/2ML IJ SOLN
INTRAMUSCULAR | Status: DC | PRN
  Administered 2019-02-06: 50 ug via INTRAVENOUS

## 2019-02-06 MED ORDER — CLINDAMYCIN PHOSPHATE 600 MG/50ML IV SOLN
600.0000 mg | Freq: Four times a day (QID) | INTRAVENOUS | Status: AC
  Administered 2019-02-06 – 2019-02-07 (×3): 600 mg via INTRAVENOUS
  Filled 2019-02-06 (×3): qty 50

## 2019-02-06 MED ORDER — OXYCODONE HCL 5 MG/5ML PO SOLN: 5.0000 mg | Freq: Once | ORAL | Status: AC | PRN

## 2019-02-06 MED ORDER — PHENOL 1.4 % MT LIQD: 1.0000 | OROMUCOSAL | Status: DC | PRN

## 2019-02-06 MED ORDER — TRANEXAMIC ACID-NACL 1000-0.7 MG/100ML-% IV SOLN
1000.0000 mg | Freq: Once | INTRAVENOUS | Status: DC
  Filled 2019-02-06: qty 100

## 2019-02-06 MED ORDER — OXYCODONE HCL 5 MG PO TABS
5.0000 mg | ORAL_TABLET | Freq: Once | ORAL | Status: AC | PRN
  Administered 2019-02-06: 5 mg via ORAL

## 2019-02-06 MED ORDER — FENTANYL CITRATE (PF) 250 MCG/5ML IJ SOLN
INTRAMUSCULAR | Status: AC
  Filled 2019-02-06: qty 5

## 2019-02-06 MED ORDER — PROPOFOL 10 MG/ML IV BOLUS
INTRAVENOUS | Status: AC
  Filled 2019-02-06: qty 40

## 2019-02-06 MED ORDER — MENTHOL 3 MG MT LOZG: 1.0000 | LOZENGE | OROMUCOSAL | Status: DC | PRN

## 2019-02-06 MED ORDER — BUPIVACAINE HCL 0.5 % IJ SOLN
INTRAMUSCULAR | Status: DC | PRN
  Administered 2019-02-06: 25 mL

## 2019-02-06 MED ORDER — NAPROXEN 250 MG PO TABS
250.0000 mg | ORAL_TABLET | Freq: Two times a day (BID) | ORAL | Status: DC
  Administered 2019-02-06 – 2019-02-07 (×2): 250 mg via ORAL
  Filled 2019-02-06 (×2): qty 1

## 2019-02-06 MED ORDER — FENTANYL CITRATE (PF) 100 MCG/2ML IJ SOLN
INTRAMUSCULAR | Status: AC
  Administered 2019-02-06: 100 ug via INTRAVENOUS
  Filled 2019-02-06: qty 2

## 2019-02-06 MED ORDER — EPHEDRINE SULFATE 50 MG/ML IJ SOLN
INTRAMUSCULAR | Status: DC | PRN
  Administered 2019-02-06: 10 mg via INTRAVENOUS
  Administered 2019-02-06: 15 mg via INTRAVENOUS
  Administered 2019-02-06: 10 mg via INTRAVENOUS
  Administered 2019-02-06: 15 mg via INTRAVENOUS

## 2019-02-06 MED ORDER — SUGAMMADEX SODIUM 200 MG/2ML IV SOLN
INTRAVENOUS | Status: DC | PRN
  Administered 2019-02-06: 200 mg via INTRAVENOUS

## 2019-02-06 MED ORDER — TRANEXAMIC ACID-NACL 1000-0.7 MG/100ML-% IV SOLN
INTRAVENOUS | Status: AC
  Filled 2019-02-06: qty 100

## 2019-02-06 MED ORDER — LIDOCAINE 2% (20 MG/ML) 5 ML SYRINGE
INTRAMUSCULAR | Status: DC | PRN
  Administered 2019-02-06: 80 mg via INTRAVENOUS
  Administered 2019-02-06: 20 mg via INTRAVENOUS

## 2019-02-06 MED ORDER — OXYCODONE HCL 5 MG PO TABS: 5.0000 mg | ORAL_TABLET | Freq: Once | ORAL | Status: DC | PRN

## 2019-02-06 MED ORDER — ONDANSETRON HCL 4 MG PO TABS: 4.0000 mg | ORAL_TABLET | Freq: Four times a day (QID) | ORAL | Status: DC | PRN

## 2019-02-06 MED ORDER — MIDAZOLAM HCL 2 MG/2ML IJ SOLN
INTRAMUSCULAR | Status: AC
  Administered 2019-02-06: 2 mg via INTRAVENOUS
  Filled 2019-02-06: qty 2

## 2019-02-06 MED ORDER — ROCURONIUM BROMIDE 10 MG/ML (PF) SYRINGE
PREFILLED_SYRINGE | INTRAVENOUS | Status: AC
  Filled 2019-02-06: qty 10

## 2019-02-06 MED ORDER — ACETAMINOPHEN 325 MG PO TABS: 325.0000 mg | ORAL_TABLET | Freq: Four times a day (QID) | ORAL | Status: DC | PRN

## 2019-02-06 MED ORDER — METHOCARBAMOL 1000 MG/10ML IJ SOLN
500.0000 mg | Freq: Four times a day (QID) | INTRAVENOUS | Status: DC | PRN
  Filled 2019-02-06: qty 5

## 2019-02-06 MED ORDER — DEXAMETHASONE SODIUM PHOSPHATE 10 MG/ML IJ SOLN
INTRAMUSCULAR | Status: AC
  Filled 2019-02-06: qty 1

## 2019-02-06 MED ORDER — EPHEDRINE 5 MG/ML INJ
INTRAVENOUS | Status: AC
  Filled 2019-02-06: qty 10

## 2019-02-06 MED ORDER — TRANEXAMIC ACID-NACL 1000-0.7 MG/100ML-% IV SOLN
1000.0000 mg | INTRAVENOUS | Status: AC
  Administered 2019-02-06: 1000 mg via INTRAVENOUS

## 2019-02-06 MED ORDER — METHOCARBAMOL 500 MG PO TABS
500.0000 mg | ORAL_TABLET | Freq: Four times a day (QID) | ORAL | Status: DC | PRN
  Administered 2019-02-07: 500 mg via ORAL
  Filled 2019-02-06: qty 1

## 2019-02-06 MED ORDER — ONDANSETRON HCL 4 MG/2ML IJ SOLN: 4.0000 mg | Freq: Once | INTRAMUSCULAR | Status: DC | PRN

## 2019-02-06 MED ORDER — PROPOFOL 10 MG/ML IV BOLUS
INTRAVENOUS | Status: DC | PRN
  Administered 2019-02-06: 200 mg via INTRAVENOUS

## 2019-02-06 MED ORDER — ONDANSETRON HCL 4 MG/2ML IJ SOLN
INTRAMUSCULAR | Status: AC
  Filled 2019-02-06: qty 2

## 2019-02-06 MED ORDER — 0.9 % SODIUM CHLORIDE (POUR BTL) OPTIME
TOPICAL | Status: DC | PRN
  Administered 2019-02-06: 1000 mL

## 2019-02-06 MED ORDER — PHENYLEPHRINE HCL-NACL 10-0.9 MG/250ML-% IV SOLN
INTRAVENOUS | Status: DC | PRN
  Administered 2019-02-06: 25 ug/min via INTRAVENOUS

## 2019-02-06 MED ORDER — CEFAZOLIN SODIUM-DEXTROSE 2-4 GM/100ML-% IV SOLN
INTRAVENOUS | Status: AC
  Filled 2019-02-06: qty 100

## 2019-02-06 MED ORDER — HYDROCODONE-ACETAMINOPHEN 5-325 MG PO TABS
1.0000 | ORAL_TABLET | ORAL | Status: DC | PRN
  Administered 2019-02-06: 1 via ORAL
  Filled 2019-02-06: qty 1

## 2019-02-06 MED ORDER — METOCLOPRAMIDE HCL 5 MG/ML IJ SOLN: 5.0000 mg | Freq: Three times a day (TID) | INTRAMUSCULAR | Status: DC | PRN

## 2019-02-06 MED ORDER — TAMSULOSIN HCL 0.4 MG PO CAPS
0.4000 mg | ORAL_CAPSULE | Freq: Every day | ORAL | Status: DC
  Administered 2019-02-06: 0.4 mg via ORAL
  Filled 2019-02-06: qty 1

## 2019-02-06 MED ORDER — OXYCODONE HCL 5 MG/5ML PO SOLN: 5.0000 mg | Freq: Once | ORAL | Status: DC | PRN

## 2019-02-06 MED ORDER — MORPHINE SULFATE (PF) 2 MG/ML IV SOLN: 0.5000 mg | INTRAVENOUS | Status: DC | PRN

## 2019-02-06 MED ORDER — MIDAZOLAM HCL 2 MG/2ML IJ SOLN
2.0000 mg | Freq: Once | INTRAMUSCULAR | Status: AC
  Administered 2019-02-06: 13:00:00 2 mg via INTRAVENOUS

## 2019-02-06 MED ORDER — ASPIRIN EC 325 MG PO TBEC
325.0000 mg | DELAYED_RELEASE_TABLET | Freq: Every day | ORAL | Status: DC | PRN
  Filled 2019-02-06: qty 1

## 2019-02-06 SURGICAL SUPPLY — 66 items
ALCOHOL 70% 16 OZ (MISCELLANEOUS) ×3 IMPLANT
ANCHOR FBRTK 2.6 SUTURETAP 1.3 (Anchor) ×6 IMPLANT
ANCHOR SUT BIO SW 4.75X19.1 (Anchor) ×9 IMPLANT
BIT DRILL 5/64X5 DISP (BIT) IMPLANT
BLADE SAG 18X100X1.27 (BLADE) ×3 IMPLANT
BODY TRUNION ECLIPSE 45 SL (Shoulder) ×3 IMPLANT
CALIBRATOR GLENOID VIP 5-D (SYSTAGENIX WOUND MANAGEMENT) ×3 IMPLANT
CEMENT BONE R 1X40 (Cement) ×3 IMPLANT
CLOSURE STERI-STRIP 1/2X4 (GAUZE/BANDAGES/DRESSINGS) ×1
CLOSURE WOUND 1/2 X4 (GAUZE/BANDAGES/DRESSINGS) ×1
CLSR STERI-STRIP ANTIMIC 1/2X4 (GAUZE/BANDAGES/DRESSINGS) ×2 IMPLANT
COVER SURGICAL LIGHT HANDLE (MISCELLANEOUS) ×3 IMPLANT
COVER WAND RF STERILE (DRAPES) ×3 IMPLANT
DRAPE INCISE IOBAN 66X45 STRL (DRAPES) ×3 IMPLANT
DRAPE ORTHO SPLIT 77X108 STRL (DRAPES) ×4
DRAPE SURG ORHT 6 SPLT 77X108 (DRAPES) ×2 IMPLANT
DRAPE U-SHAPE 47X51 STRL (DRAPES) ×3 IMPLANT
DRESSING AQUACEL AG SP 3.5X10 (GAUZE/BANDAGES/DRESSINGS) ×1 IMPLANT
DRSG ADAPTIC 3X8 NADH LF (GAUZE/BANDAGES/DRESSINGS) ×3 IMPLANT
DRSG AQUACEL AG ADV 3.5X 6 (GAUZE/BANDAGES/DRESSINGS) ×3 IMPLANT
DRSG AQUACEL AG SP 3.5X10 (GAUZE/BANDAGES/DRESSINGS) ×3
DRSG PAD ABDOMINAL 8X10 ST (GAUZE/BANDAGES/DRESSINGS) ×3 IMPLANT
DURAPREP 26ML APPLICATOR (WOUND CARE) ×3 IMPLANT
ELECT BLADE 4.0 EZ CLEAN MEGAD (MISCELLANEOUS) ×3
ELECT REM PT RETURN 9FT ADLT (ELECTROSURGICAL) ×3
ELECTRODE BLDE 4.0 EZ CLN MEGD (MISCELLANEOUS) ×1 IMPLANT
ELECTRODE REM PT RTRN 9FT ADLT (ELECTROSURGICAL) ×1 IMPLANT
GAUZE SPONGE 4X4 12PLY STRL (GAUZE/BANDAGES/DRESSINGS) ×3 IMPLANT
GLENOID UNI VAULTLOCK LRG (Shoulder) ×3 IMPLANT
GLOVE BIO SURGEON STRL SZ7.5 (GLOVE) ×3 IMPLANT
GLOVE BIOGEL PI IND STRL 8 (GLOVE) ×1 IMPLANT
GLOVE BIOGEL PI INDICATOR 8 (GLOVE) ×2
GOWN STRL REUS W/ TWL LRG LVL3 (GOWN DISPOSABLE) IMPLANT
GOWN STRL REUS W/ TWL XL LVL3 (GOWN DISPOSABLE) ×2 IMPLANT
GOWN STRL REUS W/TWL LRG LVL3 (GOWN DISPOSABLE)
GOWN STRL REUS W/TWL XL LVL3 (GOWN DISPOSABLE) ×4
HEAD HUMERAL ECLIPSE 45/17 (Shoulder) ×3 IMPLANT
KIT BASIN OR (CUSTOM PROCEDURE TRAY) ×3 IMPLANT
KIT TURNOVER KIT B (KITS) ×3 IMPLANT
MANIFOLD NEPTUNE II (INSTRUMENTS) ×3 IMPLANT
NEEDLE TAPERED W/ NITINOL LOOP (MISCELLANEOUS) ×3 IMPLANT
NS IRRIG 1000ML POUR BTL (IV SOLUTION) ×3 IMPLANT
PACK SHOULDER (CUSTOM PROCEDURE TRAY) ×3 IMPLANT
PAD ARMBOARD 7.5X6 YLW CONV (MISCELLANEOUS) ×6 IMPLANT
PIN NITINOL TARGETER 2.8 (PIN) ×3 IMPLANT
RESTRAINT HEAD UNIVERSAL NS (MISCELLANEOUS) ×3 IMPLANT
SCREW MED ECLIPSE 35 (Screw) ×3 IMPLANT
SIZER ECLIPSE CAGE SCREW (ORTHOPEDIC DISPOSABLE SUPPLIES) ×3 IMPLANT
SLING ARM FOAM STRAP LRG (SOFTGOODS) IMPLANT
SLING ARM FOAM STRAP MED (SOFTGOODS) IMPLANT
SLING ARM IMMOBILIZER LRG (SOFTGOODS) ×3 IMPLANT
SMARTMIX MINI TOWER (MISCELLANEOUS) ×3
SPONGE LAP 18X18 RF (DISPOSABLE) ×3 IMPLANT
SPONGE LAP 4X18 RFD (DISPOSABLE) IMPLANT
STRIP CLOSURE SKIN 1/2X4 (GAUZE/BANDAGES/DRESSINGS) ×2 IMPLANT
SUCTION FRAZIER HANDLE 10FR (MISCELLANEOUS) ×2
SUCTION TUBE FRAZIER 10FR DISP (MISCELLANEOUS) ×1 IMPLANT
SUT FIBERWIRE #2 38 T-5 BLUE (SUTURE) ×6
SUT MNCRL AB 4-0 PS2 18 (SUTURE) ×3 IMPLANT
SUT VIC AB 2-0 CT1 27 (SUTURE) ×2
SUT VIC AB 2-0 CT1 TAPERPNT 27 (SUTURE) ×1 IMPLANT
SUTURE FIBERWR #2 38 T-5 BLUE (SUTURE) ×2 IMPLANT
TOWEL GREEN STERILE (TOWEL DISPOSABLE) ×3 IMPLANT
TOWER SMARTMIX MINI (MISCELLANEOUS) ×1 IMPLANT
WATER STERILE IRR 1000ML POUR (IV SOLUTION) ×3 IMPLANT
YANKAUER SUCT BULB TIP NO VENT (SUCTIONS) ×3 IMPLANT

## 2019-02-06 NOTE — Transfer of Care (Signed)
Immediate Anesthesia Transfer of Care Note  Patient: Daniel Lucero [facility]  Procedure(s) Performed: TOTAL SHOULDER ARTHROPLASTY (Left Shoulder)  Patient Location: PACU  Anesthesia Type:General and Regional  Level of Consciousness: drowsy  Airway & Oxygen Therapy: Patient Spontanous Breathing and Patient connected to face mask oxygen  Post-op Assessment: Report given to RN and Post -op Vital signs reviewed and stable  Post vital signs: Reviewed and stable  Last Vitals:  Vitals Value Taken Time  BP 150/82 02/06/19 1546  Temp    Pulse 76 02/06/19 1547  Resp 11 02/06/19 1547  SpO2 100 % 02/06/19 1547  Vitals shown include unvalidated device data.  Last Pain:  Vitals:   02/06/19 1111  TempSrc:   PainSc: 3          Complications: No apparent anesthesia complications

## 2019-02-06 NOTE — Anesthesia Postprocedure Evaluation (Signed)
Anesthesia Post Note  Patient: Daniel Lucero [facility]  Procedure(s) Performed: TOTAL SHOULDER ARTHROPLASTY (Left Shoulder)     Patient location during evaluation: PACU Anesthesia Type: General Level of consciousness: awake and alert Pain management: pain level controlled Vital Signs Assessment: post-procedure vital signs reviewed and stable Respiratory status: spontaneous breathing, nonlabored ventilation, respiratory function stable and patient connected to nasal cannula oxygen Cardiovascular status: blood pressure returned to baseline and stable Postop Assessment: no apparent nausea or vomiting Anesthetic complications: no    Last Vitals:  Vitals:   02/06/19 1615 02/06/19 1616  BP: (!) 142/93 (!) 142/93  Pulse: 87 87  Resp: 19 17  Temp:    SpO2: 98% 99%    Last Pain:  Vitals:   02/06/19 1615  TempSrc:   PainSc: 3                  Daniel Lucero

## 2019-02-06 NOTE — Anesthesia Procedure Notes (Signed)
Anesthesia Regional Block: Interscalene brachial plexus block   Pre-Anesthetic Checklist: ,, timeout performed, Correct Patient, Correct Site, Correct Laterality, Correct Procedure, Correct Position, site marked, Risks and benefits discussed,  Surgical consent,  Pre-op evaluation,  At surgeon's request and post-op pain management  Laterality: Left  Prep: chloraprep       Needles:  Injection technique: Single-shot  Needle Type: Echogenic Stimulator Needle     Needle Length: 5cm  Needle Gauge: 22     Additional Needles:   Procedures:, nerve stimulator,,, ultrasound used (permanent image in chart),,,,   Nerve Stimulator or Paresthesia:  Response: quadraceps contraction, 0.45 mA,   Additional Responses:   Narrative:  Start time: 02/06/2019 12:30 PM End time: 02/06/2019 12:35 PM Injection made incrementally with aspirations every 5 mL.  Performed by: Personally  Anesthesiologist: Janeece Riggers, MD  Additional Notes: Functioning IV was confirmed and monitors were applied.  A 61mm 22ga Arrow echogenic stimulator needle was used. Sterile prep and drape,hand hygiene and sterile gloves were used. Ultrasound guidance: relevant anatomy identified, needle position confirmed, local anesthetic spread visualized around nerve(s)., vascular puncture avoided.  Image printed for medical record. Negative aspiration and negative test dose prior to incremental administration of local anesthetic. The patient tolerated the procedure well.

## 2019-02-06 NOTE — H&P (Signed)
ORTHOPAEDIC H and P  REQUESTING PHYSICIAN: Nicholes Stairs, MD  PCP:  Forrest Moron, MD  Chief Complaint: Left shoulder osteoarthritis  HPI: Daniel Lucero is a 68 y.o. male who complains of increasing left shoulder pain and stiffness.  He is here today for total shoulder replacement.  No new complaints at this time.  Past Medical History:  Diagnosis Date  . Depression   . History of kidney stones    Past Surgical History:  Procedure Laterality Date  . HERNIA REPAIR    . TONSILLECTOMY     Social History   Socioeconomic History  . Marital status: Married    Spouse name: Not on file  . Number of children: Not on file  . Years of education: Not on file  . Highest education level: Not on file  Occupational History  . Not on file  Social Needs  . Financial resource strain: Not on file  . Food insecurity    Worry: Not on file    Inability: Not on file  . Transportation needs    Medical: Not on file    Non-medical: Not on file  Tobacco Use  . Smoking status: Former Smoker    Types: Cigarettes  . Smokeless tobacco: Never Used  Substance and Sexual Activity  . Alcohol use: Yes    Alcohol/week: 5.0 standard drinks    Types: 5 Cans of beer per week  . Drug use: Never  . Sexual activity: Not on file  Lifestyle  . Physical activity    Days per week: Not on file    Minutes per session: Not on file  . Stress: Not on file  Relationships  . Social Herbalist on phone: Not on file    Gets together: Not on file    Attends religious service: Not on file    Active member of club or organization: Not on file    Attends meetings of clubs or organizations: Not on file    Relationship status: Not on file  Other Topics Concern  . Not on file  Social History Narrative  . Not on file   Family History  Problem Relation Age of Onset  . Alzheimer's disease Mother   . Hypertension Father   . Heart disease Father    Allergies  Allergen Reactions   . Penicillins Rash and Other (See Comments)    Has patient had a PCN reaction causing immediate rash, facial/tongue/throat swelling, SOB or lightheadedness with hypotension: Yes Has patient had a PCN reaction causing severe rash involving mucus membranes or skin necrosis: No Has patient had a PCN reaction that required hospitalization: No Has patient had a PCN reaction occurring within the last 10 years: No If all of the above answers are "NO", then may proceed with Cephalosporin use.   Prior to Admission medications   Medication Sig Start Date End Date Taking? Authorizing Provider  aspirin 325 MG EC tablet Take 325 mg by mouth daily as needed for pain (headache).    Yes [provider]  tamsulosin (FLOMAX) 0.4 MG CAPS capsule Take 0.4 mg by mouth at bedtime.   Yes [provider]   No results found.  Positive ROS: All other systems have been reviewed and were otherwise negative with the exception of those mentioned in the HPI and as above.  Physical Exam: General: Alert, no acute distress Cardiovascular: No pedal edema Respiratory: No cyanosis, no use of accessory musculature GI: No organomegaly, abdomen is  soft and non-tender Skin: No lesions in the area of chief complaint Neurologic: Sensation intact distally Psychiatric: Patient is competent for consent with normal mood and affect Lymphatic: No axillary or cervical lymphadenopathy  MUSCULOSKELETAL:  Left shoulder:  No open wounds.  Skin is clean, dry, and intact.  Neurovascularly intact.  Assessment: Left shoulder end-stage osteoarthritis  Plan: -Our plan is for total shoulder arthroplasty of the left shoulder with overnight admission.  - The risks, benefits, and alternatives were discussed with the patient. There are risks associated with the surgery including, but not limited to, problems with anesthesia (death), infection,  fracture of bones, loosening or failure of implants, malunion, nonunion, hematoma  (blood accumulation) which may require surgical drainage, blood clots, pulmonary embolism, nerve injury, and blood vessel injury. The patient understands these risks and elects to proceed.  -All questions solicited and answered to his satisfaction.   Nicholes Stairs, MD Cell [phone]    02/06/2019 12:17 PM

## 2019-02-06 NOTE — Discharge Instructions (Signed)
Ortho D/C instructions  - maintain sling to left arm at all times unless ADLs or exercises - no weight bearing to the left arm - maintain post op bandage to the left arm until follow up appointment.  Ok to shower with this bandage in place, but do NOT submerge under water.  - apply ice to the left shoulder for 20-30 minutes per hour that you are awake.  -for mild to moderate pain use tylenol or advil around the clock and oxycodone as needed for breakthrough pain if needed.  - return to see Dr. Stann Mainland in 2 weeks for wound check.

## 2019-02-06 NOTE — Progress Notes (Signed)
Spoke to Dr. Ambrose Pancoast regarding patient's BP.  Dr. Ambrose Pancoast stated he is coming to bedside to see patient.

## 2019-02-06 NOTE — Op Note (Signed)
Date of Surgery: 02/06/2019  INDICATIONS: Daniel Lucero is a 68 y.o.-year-old male with a left shoulder end-stage osteoarthritis. He has failed exhaustive conservative measures including formal outpatient therapy, steroid injections, and oral medications.  He is elected for anatomic shoulder replacement and is here today for that surgery.;  The patient did consent to the procedure after discussion of the risks and benefits.  PREOPERATIVE DIAGNOSIS:  1.  Left shoulder osteoarthritis  POSTOPERATIVE DIAGNOSIS: Same.  PROCEDURE:  1.  Left shoulder anatomic arthroplasty 2. Transfer of long head of biceps tendon to pectoralis major tendon  SURGEON: Geralynn Rile, M.D.  ASSIST: Laure Kidney, RNFA.  ANESTHESIA:  general  IV FLUIDS AND URINE: See anesthesia.  ESTIMATED BLOOD LOSS: 100 mL.  IMPLANTS:  Arthrex Eclipse stemless implant.  45 mm trunnion, 45 x 17 humeral head with a medium cage screw Humeral head autograft.  Large glenoid   Double row repair of subscapularis with 2.6 mm Arthrex fiber tacks medially x2, 4.75 mm Arthrex swivel lock anchors x2 lateral  DRAINS: None  COMPLICATIONS: None.  DESCRIPTION OF PROCEDURE: After obtaining informed consent,The patient was brought to the operating room and placed supine on the operating table.  The patient had been signed prior to the procedure and this was documented. The patient had the anesthesia placed by the anesthesiologist.  A time-out was performed to confirm that this was the correct patient, site, side and location. The patient did receive antibiotics prior to the incision and was re-dosed during the procedure as needed at indicated intervals.   The patient had the operative extremity prepped and draped in the standard surgical fashion.   The head and neck were nicely stabilized.   A 10 blade was used to make a standard deltopectoral approach to the shoulder.  Dissection was carried out through the subcutaneous  tissue to where the deltopectoral interval was visualized and developed.  The cephalic vein was mobilized and retracted medially for the duration of the case.  The subacromial space was cleared bluntly retractors were placed appropriately deep to the pectoralis major tendon and deep to the deltoid muscle fascia.  The upper one third of the pectoralis muscle insertion on the humerus was sharply released.    Rotator cuff tendons were intact x4.  There was moderate subacromial bursitis.  Next I performed a subscapularis takedown and a peel fashion.  Subscapularis, rotator interval, and the inferior capsule were all released.  There were inferior humeral osteophytes that were removed with rongeur and curved osteotome.  After, Releases, a humeral head osteotomy was performed in 30 of retroversion and at 132.5 head neck angle.  A protractor was placed within the glenoid vault to protect axillary nerve and posterior capsular structures.  Next we prepared the humerus.  We we selected the 45 mm humeral trunnion.  Well with good osteotomy coverage.  We then prepared for the stem with cage screw with a hand reamer and then drill bit.  The medium cage screw was appropriate length.  Following sizing we then placed a protective cover over our osteotomy of the glenoid.  Retractors were placed anteriorly, superiorly, and posteriorly and the labrum was excised.  Exposure of the glenoid was obtained.  The center drill bit was used followed by sequential reaming.  Next using the pegged glenoid guide the holes were drilled appropriately.  The small size glenoid proved to be the appropriate size for this glenoid.  Next the holes were drilled and chiseled appropriately.  Glenoid  was washed with  antibiotic irrigation.  Next glenoid vault was dried.  We trialed once again and were satisfied with the large sized the glenoid.  Next, using cementing the superior and inferior holes the glenoid was cemented into place.  Of note, the  center ball-like peg was filled with humeral head autograft and not cemented per the manufacturer's recommendation.  Excellent fixation of the glenoid was obtained.   We then turned our attention back to the humeral side.    We first trialed the 45 x 17 mm head.  This had excellent pushback of about 60% with good spontaneous reduction with the arm in neutral rotation and flexion. Likewise inferior pull down of 50% with spontaneous reduction.  That head was then removed and the final humeral head was malleted in place with the above size.   Lastly we turned our attention to the subscapularis repair.  The shoulder was copiously irrigated once again with antibiotic solution.  This was accomplished in a double row transosseous equivalent.  Medial row was prepared 42.6 mm fiber tack suture anchors.  Double loaded with suture tape.  The superior anchor was first placed at the level of the top of the biceps groove and lesser tuberosity.  Likewise distal to that the second medial row anchor was placed.  We then passed medial tendon.  Next we placed 2 figure-of-eight #2 FiberWire sutures in the rotator interval to establish the appropriate length of our subscapularis repair.  We then completed our repair with 2 lateral anchors utilizing 4.75 mm Arthrex excellent tension.  Lastly, with type a medial suture tapes to each other to complete a compression coverage on the medial side.  The tendon had excellent excursion and moved as 1 unit with the humerus.    We evaluated once again for hemostasis.  The wound was closed in layers with a #2 FiberWire in the deltopectoral interval.    1 g of vancomycin powder was placed in the deep deltopectoral interval.  We placed an 0 Vicryl running locking stitch subcutaneous fat, and then 2-0 Vicryl for the deep dermal layer and 3-0 Monocryl in a subcuticular fashion followed by Dermabond glue.  A standard sterile occlusive dressing was applied as well as a postoperative  sling.   The patient tolerated the procedure well.  All counts were correct 2.  There were no intraoperative complications.  The patient was transferred to PACU in stable condition.   Disposition: The patient will be nonweightbearing to the operative extremity for proximally 6 weeks.  He will begin physical therapy in the outpatient setting.  He will be admitted for routine postoperative care including antibiotics, pain control, and DVT prophylaxis.  Sling will be maintained at all times.  Return for wound check in approximately 2 weeks and then again at 6 weeks.  POSTOPERATIVE PLAN:  Sling to the operative extremity and nonweightbearing.  No active motion to the shoulder.  Conservative postoperative protocol.  Follow-up in 2 weeks.

## 2019-02-06 NOTE — Anesthesia Preprocedure Evaluation (Signed)
Anesthesia Evaluation  Patient identified by MRN, date of birth, ID band Patient awake    Reviewed: Allergy & Precautions, H&P , NPO status , Patient's Chart, lab work & pertinent test results, reviewed documented beta blocker date and time   Airway Mallampati: II  TM Distance: >3 FB Neck ROM: full    Dental no notable dental hx. (+) Teeth Intact, Dental Advisory Given   Pulmonary neg pulmonary ROS, former smoker,    Pulmonary exam normal breath sounds clear to auscultation       Cardiovascular Exercise Tolerance: Good negative cardio ROS   Rhythm:regular Rate:Normal     Neuro/Psych negative neurological ROS  negative psych ROS   GI/Hepatic negative GI ROS, Neg liver ROS,   Endo/Other  negative endocrine ROS  Renal/GU negative Renal ROS  negative genitourinary   Musculoskeletal  (+) Arthritis , Osteoarthritis,    Abdominal   Peds  Hematology negative hematology ROS (+)   Anesthesia Other Findings   Reproductive/Obstetrics negative OB ROS                             Anesthesia Physical Anesthesia Plan  ASA: II  Anesthesia Plan: General   Post-op Pain Management: GA combined w/ Regional for post-op pain   Induction:   PONV Risk Score and Plan: 2 and Ondansetron, Dexamethasone and Treatment may vary due to age or medical condition  Airway Management Planned: LMA and Oral ETT  Additional Equipment:   Intra-op Plan:   Post-operative Plan: Extubation in OR  Informed Consent: I have reviewed the patients History and Physical, chart, labs and discussed the procedure including the risks, benefits and alternatives for the proposed anesthesia with the patient or authorized representative who has indicated his/her understanding and acceptance.     Dental Advisory Given  Plan Discussed with: CRNA, Anesthesiologist and Surgeon  Anesthesia Plan Comments: (Discussed both nerve block  for pain relief post-op and GA; including NV, sore throat, dental injury, and pulmonary complications)        Anesthesia Quick Evaluation

## 2019-02-06 NOTE — Progress Notes (Signed)
Patient states he had a delayed reaction to PCN when he was 68 years old.  Patient developed rash 2 weeks after receiving PCN.

## 2019-02-06 NOTE — Brief Op Note (Signed)
02/06/2019  3:39 PM  PATIENT:  Junious Silk Villari  68 y.o. male  PRE-OPERATIVE DIAGNOSIS:  Left shoulder osteoarthritis  POST-OPERATIVE DIAGNOSIS:  Left shoulder osteoarthritis  PROCEDURE:  Procedure(s) with comments: TOTAL SHOULDER ARTHROPLASTY (Left) - 2.5 hrs  SURGEON:  Surgeon(s) and Role:    * Stann Mainland, Elly Modena, MD - Primary   ASSISTANTS: Laure Kidney, RNFA   ANESTHESIA:   regional and general  EBL:  150 mL   BLOOD ADMINISTERED:none  DRAINS: none   LOCAL MEDICATIONS USED:  NONE  SPECIMEN:  No Specimen  DISPOSITION OF SPECIMEN:  N/A  COUNTS:  YES  TOURNIQUET:  * No tourniquets in log *  DICTATION: .Note written in EPIC  PLAN OF CARE: Admit to inpatient   PATIENT DISPOSITION:  PACU - hemodynamically stable.   Delay start of Pharmacological VTE agent (>24hrs) due to surgical blood loss or risk of bleeding: not applicable

## 2019-02-06 NOTE — Anesthesia Procedure Notes (Signed)
Procedure Name: Intubation Date/Time: 02/06/2019 12:52 PM Performed by: Scheryl Darter, CRNA Pre-anesthesia Checklist: Patient identified, Emergency Drugs available, Suction available and Patient being monitored Patient Re-evaluated:Patient Re-evaluated prior to induction Oxygen Delivery Method: Circle System Utilized Preoxygenation: Pre-oxygenation with 100% oxygen Induction Type: IV induction Ventilation: Mask ventilation without difficulty Laryngoscope Size: Miller and 3 Tube type: Oral Tube size: 7.5 mm Number of attempts: 1 Airway Equipment and Method: Stylet and Oral airway Placement Confirmation: ETT inserted through vocal cords under direct vision,  positive ETCO2 and breath sounds checked- equal and bilateral Secured at: 23 cm Tube secured with: Tape Dental Injury: Teeth and Oropharynx as per pre-operative assessment

## 2019-02-07 ENCOUNTER — Encounter (HOSPITAL_COMMUNITY): Payer: Self-pay | Admitting: Orthopedic Surgery

## 2019-02-07 DIAGNOSIS — M25561 Pain in right knee: Secondary | ICD-10-CM | POA: Diagnosis not present

## 2019-02-07 MED ORDER — ONDANSETRON 4 MG PO TBDP: 4.0000 mg | ORAL_TABLET | Freq: Three times a day (TID) | ORAL | 0 refills | Status: DC | PRN

## 2019-02-07 MED ORDER — OXYCODONE HCL 5 MG PO TABS: 5.0000 mg | ORAL_TABLET | ORAL | 0 refills | Status: DC | PRN

## 2019-02-07 NOTE — Plan of Care (Signed)
Patient alert and oriented, mae's well, voiding adequate amount of urine, swallowing without difficulty, no c/o pain at time of discharge. Patient discharged home with family. Script and discharged instructions given to patient. Patient and family stated understanding of instructions given. Patient has an appointment with Dr. Rogers   

## 2019-02-07 NOTE — Progress Notes (Signed)
   Subjective:  Patient reports pain as mild.  PNB still working.  Some sensation intact distally.  No SOB/CP  Objective:   VITALS:   Vitals:   02/06/19 2316 02/07/19 0320 02/07/19 0727 02/07/19 1239  BP: 114/75 111/64 112/75 138/83  Pulse: 74 70 62 62  Resp: 20 18 16 16   Temp: 98 F (36.7 C) 97.7 F (36.5 C) 98.4 F (36.9 C) 98.3 F (36.8 C)  TempSrc: Oral Oral Oral Oral  SpO2: 98% 97% 97% 100%  Weight:      Height:        Sensation intact distally Intact pulses distally Incision: dressing C/D/I Compartment soft slingl in place Decreases Axillary n and radial n, 2/2 PNB  Lab Results  Component Value Date   WBC 5.5 02/02/2019   HGB 15.1 02/02/2019   HCT 43.3 02/02/2019   MCV 91.2 02/02/2019   PLT 211 02/02/2019   BMET    Component Value Date/Time   NA 140 02/02/2019 0845   K 4.1 02/02/2019 0845   CL 104 02/02/2019 0845   CO2 26 02/02/2019 0845   GLUCOSE 90 02/02/2019 0845   BUN 18 02/02/2019 0845   CREATININE 1.11 02/02/2019 0845   CALCIUM 9.4 02/02/2019 0845   GFRNONAA >60 02/02/2019 0845   GFRAA >60 02/02/2019 0845     Assessment/Plan: 1 Day Post-Op   Active Problems:   Osteoarthritis of left shoulder   S/P shoulder replacement, left   Advance diet - dc home today - NWB with sling to LUE - return in 2 weeks for wound check   Nicholes Stairs 02/07/2019, 2:48 PM   Geralynn Rile, MD [phone]

## 2019-02-07 NOTE — Progress Notes (Signed)
Orthopedic Tech Progress Note Patient Details:  Daniel Lucero [facility] [birth year] [id number]  Ortho Devices Type of Ortho Device: Sling immobilizer Ortho Device/Splint Location: left Ortho Device/Splint Interventions: Application   Post Interventions Patient Tolerated: Well Instructions Provided: Care of device   [name] 02/07/2019, 9:05 AM

## 2019-02-07 NOTE — Evaluation (Signed)
Occupational Therapy Evaluation Patient Details Name: Daniel Lucero MRN: [id number] DOB: [birth year] Today's Date: 02/07/2019    History of Present Illness 68 yo male s/p left total shoulder arthroplasty. PMH including depression and kidney stones.    Clinical Impression   PTA, pt was living with his wife and was independent. Pt currently requiring Min A for UB dressing/sling management and Supervision for bathing, LB dressing, and functional mobility. Provided education and handout on shoulder precautions, exercises, bed mobility and sleep positions, sling management, UB ADLs, LB ADLs, and tub transfer with seat. Pt verbalized and demonstrated understanding. Recommend dc to home once medically stable pert physician. Answered all questions and provided all education in preparation for possible dc later today.     Follow Up Recommendations  Follow surgeon's recommendation for DC plan and follow-up therapies;Supervision - Intermittent(FOllow up at OP once ready per MD)    Equipment Recommendations  Tub/shower seat    Recommendations for Other Services       Precautions / Restrictions Precautions Precautions: Shoulder Type of Shoulder Precautions: Conservative protocal. No ROM of left shoulder. WFL for left hand, wrist, and elbow. Shoulder Interventions: Shoulder sling/immobilizer;Off for dressing/bathing/exercises;At all times Precaution Booklet Issued: Yes (comment) Precaution Comments: Reviewed shoulder precautions in full as well as compensatory techniques for ADLS Restrictions Weight Bearing Restrictions: Yes LUE Weight Bearing: Non weight bearing      Mobility Bed Mobility Overal bed mobility: Modified Independent             General bed mobility comments: Increased time  Transfers Overall transfer level: Needs assistance   Transfers: Sit to/from Stand Sit to Stand: Supervision         General transfer comment: Supervision for initital safety     Balance Overall balance assessment: No apparent balance deficits (not formally assessed)                                         ADL either performed or assessed with clinical judgement   ADL Overall ADL's : Needs assistance/impaired Eating/Feeding: Set up;Sitting   Grooming: Set up;Standing   Upper Body Bathing: Supervision/ safety;Set up;Sitting Upper Body Bathing Details (indicate cue type and reason): Educating pt on compenstory techniques for UB bathing technique in sitting. Pt demonstrating understanding.  Lower Body Bathing: Sit to/from stand;Supervison/ safety   Upper Body Dressing : Minimal assistance;Sitting Upper Body Dressing Details (indicate cue type and reason): Min A for adjusting shirt during donning and for positioning of sling with donning. Pt able to doff sling and gown with Supervision and cues.  Lower Body Dressing: Supervision/safety;Sit to/from stand Lower Body Dressing Details (indicate cue type and reason): Pt donning underwear pants with supervision for safety in standing Toilet Transfer: Supervision/safety;Ambulation(simulated to recliner)         Tub/Shower Transfer Details (indicate cue type and reason): Educated pt on use of shower seat in tub as well as safe tub transfer laterally. Pt verbalized understanding Functional mobility during ADLs: Supervision/safety General ADL Comments: Pt requiring assistance for UB dressing. Demonstrating understanding of compensatory techniques for ADLs and presenting with good adherance to ROM restrictions     Vision Baseline Vision/History: Wears glasses Wears Glasses: Reading only Patient Visual Report: No change from baseline       Perception     Praxis      Pertinent Vitals/Pain Pain Assessment: Faces Faces Pain Scale: Hurts a little bit  Pain Location: Left shoulder Pain Descriptors / Indicators: Constant;Discomfort Pain Intervention(s): Monitored during session;Repositioned;Ice applied      Hand Dominance Right   Extremity/Trunk Assessment Upper Extremity Assessment Upper Extremity Assessment: LUE deficits/detail LUE Deficits / Details: s/p left shoulder replacement. CUrrently, nerve block still in place and pt unable to perform AROM of elbow and supination into neurtral position but not beyond. WFL ROM for hand and wrist. LUE Sensation: decreased light touch LUE Coordination: decreased gross motor   Lower Extremity Assessment Lower Extremity Assessment: Overall WFL for tasks assessed   Cervical / Trunk Assessment Cervical / Trunk Assessment: Normal   Communication Communication Communication: No difficulties   Cognition Arousal/Alertness: Awake/alert Behavior During Therapy: WFL for tasks assessed/performed Overall Cognitive Status: Within Functional Limits for tasks assessed                                 General Comments: Very motivated to participate in therapy   General Comments  Noting bruising at inside of left arm near axillary area    Exercises Exercises: Shoulder;Hand exercises Shoulder Exercises Elbow Flexion: Self ROM;Left;Seated;10 reps Elbow Extension: Self ROM;Left;10 reps;Seated Wrist Flexion: AROM;Left;10 reps;Seated Wrist Extension: AROM;Left;10 reps;Seated Digit Composite Flexion: AROM;Left;10 reps;Seated Composite Extension: AROM;Left;10 reps;Seated Neck Flexion: AROM Neck Extension: AROM Neck Lateral Flexion - Right: AROM Neck Lateral Flexion - Left: AROM Hand Exercises Forearm Supination: AROM;Left;Self ROM;10 reps;Seated;Limitations Forearm Supination Limitations: Able to perform full pronation. Only able to perform supination to neutral position. Using RUE to assist in completing full supination Forearm Pronation: AROM;Left;10 reps;Seated Opposition: AROM;Left;10 reps;Seated   Shoulder Instructions Shoulder Instructions Donning/doffing shirt without moving shoulder: Minimal assistance Method for sponge bathing  under operated UE: Supervision/safety Donning/doffing sling/immobilizer: Minimal assistance Correct positioning of sling/immobilizer: Supervision/safety Pendulum exercises (written home exercise program): (NA) ROM for elbow, wrist and digits of operated UE: Supervision/safety Sling wearing schedule (on at all times/off for ADL's): Independent Proper positioning of operated UE when showering: Supervision/safety Dressing change: (NA) Positioning of UE while sleeping: Minimal assistance    Home Living Family/patient expects to be discharged to:: Private residence Living Arrangements: Spouse/significant other Available Help at Discharge: Family Type of Home: House             Bathroom Shower/Tub: Teaching laboratory technician Toilet: Standard     Home Equipment: None          Prior Functioning/Environment Level of Independence: Independent        Comments: Works in [employer] Problem List: Decreased activity tolerance;Decreased range of motion;Decreased strength;Decreased knowledge of precautions;Decreased knowledge of use of DME or AE;Impaired UE functional use;Impaired sensation      OT Treatment/Interventions:      OT Goals(Current goals can be found in the care plan section) Acute Rehab OT Goals Patient Stated Goal: Go home today OT Goal Formulation: All assessment and education complete, DC therapy  OT Frequency:     Barriers to D/C:            Co-evaluation              AM-PAC OT "6 Clicks" Daily Activity     Outcome Measure Help from another person eating meals?: None Help from another person taking care of personal grooming?: None Help from another person toileting, which includes using toliet, bedpan, or urinal?: None Help from another person bathing (including washing, rinsing, drying)?: A Little Help from  another person to put on and taking off regular upper body clothing?: A Little Help from another person to put on and taking off  regular lower body clothing?: None 6 Click Score: 22   End of Session Equipment Utilized During Treatment: Other (comment)(Sling) Nurse Communication: Mobility status  Activity Tolerance: Patient tolerated treatment well Patient left: in chair;with call bell/phone within reach  OT Visit Diagnosis: Muscle weakness (generalized) (M62.81);Pain Pain - Right/Left: Left Pain - part of body: Shoulder                Time: [id number] OT Time Calculation (min): 44 min Charges:  OT General Charges $OT Visit: 1 Visit OT Evaluation $OT Eval Low Complexity: 1 Low OT Treatments $Self Care/Home Management : 23-37 mins  Adara Kittle MSOT, OTR/L Acute Rehab Pager: [phone] Office: [place] 02/07/2019, 9:14 AM

## 2019-02-12 NOTE — Discharge Summary (Signed)
Patient ID: Daniel Lucero MRN: [id number] DOB/AGE: 1950/10/08 68 y.o.  Admit date: 02/06/2019 Discharge date: 02/07/2019  Primary Diagnosis: Left shoulder OA  Admission Diagnoses:  Past Medical History:  Diagnosis Date  . Depression   . History of kidney stones    Discharge Diagnoses:   Active Problems:   Osteoarthritis of left shoulder   S/P shoulder replacement, left  Estimated body mass index is 25.84 kg/m as calculated from the following:   Height as of this encounter: 5\' 9"  (1.753 m).   Weight as of this encounter: 79.4 kg.  Procedure:  Procedure(s) (LRB): TOTAL SHOULDER ARTHROPLASTY (Left)   Consults: None  HPI: Daniel Lucero is a very nice right-hand dominant 68 year old male who presented for elective left total shoulder replacement.  No new complaints prior to admission. Laboratory Data: Hospital Outpatient Visit on 02/02/2019  Component Date Value Ref Range Status  . SARS-CoV-2, NAA 02/02/2019 NOT DETECTED  NOT DETECTED Final   Comment: (NOTE) This nucleic acid amplification test was developed and its performance characteristics determined by [employer]. Nucleic acid amplification tests include PCR and TMA. This test has not been FDA cleared or approved. This test has been authorized by FDA under an Emergency Use Authorization (EUA). This test is only authorized for the duration of time the declaration that circumstances exist justifying the authorization of the emergency use of in vitro diagnostic tests for detection of SARS-CoV-2 virus and/or diagnosis of COVID-19 infection under section 564(b)(1) of the Act, 21 U.S.C. [id number]) (1), unless the authorization is terminated or revoked sooner. When diagnostic testing is negative, the possibility of a false negative result should be considered in the context of a patient's recent exposures and the presence of clinical signs and symptoms consistent with COVID-19. An individual without symptoms of  COVID- 19 and who is not shedding SARS-CoV-2 vi                          rus would expect to have a negative (not detected) result in this assay. Performed At: [facility] [address] [place], [place] [id number] Rush Farmer MD [id number]   . Coronavirus Source 02/02/2019 NASOPHARYNGEAL   Final   Performed at [facility], [place] [address], [place], [place] [address]  Hospital Outpatient Visit on 02/02/2019  Component Date Value Ref Range Status  . MRSA, PCR 02/02/2019 NEGATIVE  NEGATIVE Final  . Staphylococcus aureus 02/02/2019 NEGATIVE  NEGATIVE Final   Comment: (NOTE) The Xpert SA Assay (FDA approved for NASAL specimens in patients 81 years of age and older), is one component of a comprehensive surveillance program. It is not intended to diagnose infection nor to guide or monitor treatment. Performed at [facility], [place] [address], [place], [place] [address]   . Sodium 02/02/2019 140  135 - 145 mmol/L Final  . Potassium 02/02/2019 4.1  3.5 - 5.1 mmol/L Final  . Chloride 02/02/2019 104  98 - 111 mmol/L Final  . CO2 02/02/2019 26  22 - 32 mmol/L Final  . Glucose, Bld 02/02/2019 90  70 - 99 mg/dL Final  . BUN 02/02/2019 18  8 - 23 mg/dL Final  . Creatinine, Ser 02/02/2019 1.11  0.61 - 1.24 mg/dL Final  . Calcium 02/02/2019 9.4  8.9 - 10.3 mg/dL Final  . GFR calc non Af Amer 02/02/2019 >60  >60 mL/min Final  . GFR calc Af Amer 02/02/2019 >60  >60 mL/min Final  . Anion gap 02/02/2019  10  5 - 15 Final   Performed at [facility], [place] [address] [place], [place] [address]  . WBC 02/02/2019 5.5  4.0 - 10.5 K/uL Final  . RBC 02/02/2019 4.75  4.22 - 5.81 MIL/uL Final  . Hemoglobin 02/02/2019 15.1  13.0 - 17.0 g/dL Final  . HCT 02/02/2019 43.3  39.0 - 52.0 % Final  . MCV 02/02/2019 91.2  80.0 - 100.0 fL Final  . MCH 02/02/2019 31.8  26.0 - 34.0 pg Final  . MCHC 02/02/2019 34.9  30.0 - 36.0 g/dL Final  . RDW 02/02/2019 11.9  11.5 - 15.5 % Final  .  Platelets 02/02/2019 211  150 - 400 K/uL Final  . nRBC 02/02/2019 0.0  0.0 - 0.2 % Final   Performed at [facility], [place] [address], [place], [place] [address]     X-Rays:Dg Shoulder Left Port  Result Date: 02/06/2019 CLINICAL DATA:  Status post left shoulder replacement. EXAM: LEFT SHOULDER COMPARISON:  None. FINDINGS: Prosthesis appears to be well situated. No fracture or dislocation is noted. Ribs are unremarkable. IMPRESSION: Status post left shoulder arthroplasty. Electronically Signed   By: Marijo Conception M.D.   On: 02/06/2019 16:16   Dg Finger Middle Right  Result Date: 01/23/2019 CLINICAL DATA:  Crush injury EXAM: RIGHT MIDDLE FINGER 2+V COMPARISON:  None. FINDINGS: There is no evidence of fracture or dislocation. Joint spaces are preserved. No radiopaque foreign body. IMPRESSION: No acute fracture. Electronically Signed   By: Macy Mis M.D.   On: 01/23/2019 10:53    EKG:No orders found for this or any previous visit.   Hospital Course: Daniel Lucero is a 68 y.o. who was admitted to Hospital. They were brought to the operating room on 02/06/2019 and underwent Procedure(s): TOTAL SHOULDER ARTHROPLASTY.  Patient tolerated the procedure well and was later transferred to the recovery room and then to the orthopaedic floor for postoperative care.  They were given PO and IV analgesics for pain control following their surgery.  They were given 24 hours of postoperative antibiotics of  Anti-infectives (From admission, onward)   Start     Dose/Rate Route Frequency Ordered Stop   02/06/19 2000  clindamycin (CLEOCIN) IVPB 600 mg     600 mg 100 mL/hr over 30 Minutes Intravenous Every 6 hours 02/06/19 1713 02/07/19 0740   02/06/19 1514  vancomycin (VANCOCIN) powder  Status:  Discontinued       As needed 02/06/19 1515 02/06/19 1541   02/06/19 1131  ceFAZolin (ANCEF) 2-4 GM/100ML-% IVPB    Note to Pharmacy: Alvy Beal   : cabinet override      02/06/19 1131 02/06/19 1247    02/06/19 1130  ceFAZolin (ANCEF) IVPB 2g/100 mL premix     2 g 200 mL/hr over 30 Minutes Intravenous On call to O.R. 02/06/19 1119 02/06/19 1247     and started on DVT prophylaxis in the form of Aspirin.  OT were ordered for total joint protocol.  Discharge planning consulted to help with postop disposition and equipment needs.  Patient had a greta night on the evening of surgery.    Incision was healing well.  Patient was seen in rounds and was ready to go home.   Diet: Regular diet Activity:NWB Follow-up:in 2 weeks Disposition - Home Discharged Condition: good   Discharge Instructions    Call MD / Call 911   Complete by: As directed    If you experience chest pain or shortness of  breath, CALL 911 and be transported to the hospital emergency room.  If you develope a fever above 101 F, pus (white drainage) or increased drainage or redness at the wound, or calf pain, call your surgeon's office.   Constipation Prevention   Complete by: As directed    Drink plenty of fluids.  Prune juice may be helpful.  You may use a stool softener, such as Colace (over the counter) 100 mg twice a day.  Use MiraLax (over the counter) for constipation as needed.   Diet - low sodium heart healthy   Complete by: As directed    Increase activity slowly as tolerated   Complete by: As directed      Allergies as of 02/07/2019      Reactions   Penicillins Rash, Other (See Comments)   Has patient had a PCN reaction causing immediate rash, facial/tongue/throat swelling, SOB or lightheadedness with hypotension: Yes Has patient had a PCN reaction causing severe rash involving mucus membranes or skin necrosis: No Has patient had a PCN reaction that required hospitalization: No Has patient had a PCN reaction occurring within the last 10 years: No If all of the above answers are "NO", then may proceed with Cephalosporin use.      Medication List    TAKE these medications   aspirin 325 MG EC tablet Take 325  mg by mouth daily as needed for pain (headache).   ondansetron 4 MG disintegrating tablet Commonly known as: Zofran ODT Take 1 tablet (4 mg total) by mouth every 8 (eight) hours as needed.   oxyCODONE 5 MG immediate release tablet Commonly known as: Roxicodone Take 1 tablet (5 mg total) by mouth every 4 (four) hours as needed for severe pain.   tamsulosin 0.4 MG Caps capsule Commonly known as: FLOMAX Take 0.4 mg by mouth at bedtime.      Follow-up Information    Nicholes Stairs, MD In 2 weeks.   Specialty: Orthopedic Surgery Why: For wound re-check Contact information: [address] [place] [address] [id number]           Signed: Geralynn Rile, MD Orthopaedic Surgery 02/12/2019, 5:35 PM

## 2019-02-13 DIAGNOSIS — M25512 Pain in left shoulder: Secondary | ICD-10-CM | POA: Diagnosis not present

## 2019-02-19 DIAGNOSIS — M25512 Pain in left shoulder: Secondary | ICD-10-CM | POA: Diagnosis not present

## 2019-02-28 DIAGNOSIS — M25512 Pain in left shoulder: Secondary | ICD-10-CM | POA: Diagnosis not present

## 2019-03-08 DIAGNOSIS — M25512 Pain in left shoulder: Secondary | ICD-10-CM | POA: Diagnosis not present

## 2019-03-12 DIAGNOSIS — M25512 Pain in left shoulder: Secondary | ICD-10-CM | POA: Diagnosis not present

## 2019-03-14 DIAGNOSIS — M25512 Pain in left shoulder: Secondary | ICD-10-CM | POA: Diagnosis not present

## 2019-03-19 DIAGNOSIS — M25512 Pain in left shoulder: Secondary | ICD-10-CM | POA: Diagnosis not present

## 2019-03-23 DIAGNOSIS — Z4789 Encounter for other orthopedic aftercare: Secondary | ICD-10-CM | POA: Diagnosis not present

## 2019-03-28 DIAGNOSIS — M25512 Pain in left shoulder: Secondary | ICD-10-CM | POA: Diagnosis not present

## 2019-03-30 DIAGNOSIS — M25512 Pain in left shoulder: Secondary | ICD-10-CM | POA: Diagnosis not present

## 2019-04-02 DIAGNOSIS — M25512 Pain in left shoulder: Secondary | ICD-10-CM | POA: Diagnosis not present

## 2019-04-06 ENCOUNTER — Ambulatory Visit: Payer: BC Managed Care – PPO | Attending: Internal Medicine

## 2019-04-06 DIAGNOSIS — Z23 Encounter for immunization: Secondary | ICD-10-CM | POA: Insufficient documentation

## 2019-04-06 NOTE — Progress Notes (Signed)
   Covid-19 Vaccination Clinic  Name:  Daniel Lucero    MRN: [id number] DOB: 23-Oct-1950  04/06/2019  Mr. Daniel Lucero was observed post Covid-19 immunization for 15 minutes without incidence. He was provided with Vaccine Information Sheet and instruction to access the V-Safe system.   Mr. Daniel Lucero was instructed to call 911 with any severe reactions post vaccine: Marland Kitchen Difficulty breathing  . Swelling of your face and throat  . A fast heartbeat  . A bad rash all over your body  . Dizziness and weakness    Immunizations Administered    Name Date Dose VIS Date Route   Pfizer COVID-19 Vaccine 04/06/2019  1:36 PM 0.3 mL 02/23/2019 Intramuscular   Manufacturer: [place]   Lot: [id number]   [place]: [id number]

## 2019-04-09 DIAGNOSIS — M25512 Pain in left shoulder: Secondary | ICD-10-CM | POA: Diagnosis not present

## 2019-04-12 DIAGNOSIS — M25512 Pain in left shoulder: Secondary | ICD-10-CM | POA: Diagnosis not present

## 2019-04-16 DIAGNOSIS — M25512 Pain in left shoulder: Secondary | ICD-10-CM | POA: Diagnosis not present

## 2019-04-20 DIAGNOSIS — M25512 Pain in left shoulder: Secondary | ICD-10-CM | POA: Diagnosis not present

## 2019-04-24 DIAGNOSIS — M25512 Pain in left shoulder: Secondary | ICD-10-CM | POA: Diagnosis not present

## 2019-04-26 DIAGNOSIS — M25512 Pain in left shoulder: Secondary | ICD-10-CM | POA: Diagnosis not present

## 2019-04-27 ENCOUNTER — Ambulatory Visit: Payer: BC Managed Care – PPO | Attending: Internal Medicine

## 2019-04-27 DIAGNOSIS — Z23 Encounter for immunization: Secondary | ICD-10-CM | POA: Insufficient documentation

## 2019-04-27 NOTE — Progress Notes (Signed)
   Covid-19 Vaccination Clinic  Name:  BYNUM REIGEL    MRN: [id number] DOB: February 20, 1951  04/27/2019  Mr. Shutt was observed post Covid-19 immunization for 15 minutes without incidence. He was provided with Vaccine Information Sheet and instruction to access the V-Safe system.   Mr. Doorn was instructed to call 911 with any severe reactions post vaccine: Marland Kitchen Difficulty breathing  . Swelling of your face and throat  . A fast heartbeat  . A bad rash all over your body  . Dizziness and weakness    Immunizations Administered    Name Date Dose VIS Date Route   Pfizer COVID-19 Vaccine 04/27/2019  8:39 AM 0.3 mL 02/23/2019 Intramuscular   Manufacturer: [place]   Lot: [id number]   [place]: [id number]

## 2019-04-30 DIAGNOSIS — M25512 Pain in left shoulder: Secondary | ICD-10-CM | POA: Diagnosis not present

## 2019-05-04 DIAGNOSIS — M25512 Pain in left shoulder: Secondary | ICD-10-CM | POA: Diagnosis not present

## 2019-05-07 DIAGNOSIS — M25562 Pain in left knee: Secondary | ICD-10-CM | POA: Diagnosis not present

## 2019-05-11 DIAGNOSIS — M25512 Pain in left shoulder: Secondary | ICD-10-CM | POA: Diagnosis not present

## 2019-05-14 DIAGNOSIS — M25512 Pain in left shoulder: Secondary | ICD-10-CM | POA: Diagnosis not present

## 2019-05-18 DIAGNOSIS — Z4789 Encounter for other orthopedic aftercare: Secondary | ICD-10-CM | POA: Diagnosis not present

## 2019-05-28 DIAGNOSIS — M25512 Pain in left shoulder: Secondary | ICD-10-CM | POA: Diagnosis not present

## 2019-06-18 DIAGNOSIS — M25512 Pain in left shoulder: Secondary | ICD-10-CM | POA: Diagnosis not present

## 2019-06-25 DIAGNOSIS — M25512 Pain in left shoulder: Secondary | ICD-10-CM | POA: Diagnosis not present

## 2019-07-02 DIAGNOSIS — M25512 Pain in left shoulder: Secondary | ICD-10-CM | POA: Diagnosis not present

## 2019-07-09 DIAGNOSIS — M25512 Pain in left shoulder: Secondary | ICD-10-CM | POA: Diagnosis not present

## 2019-07-18 DIAGNOSIS — M25512 Pain in left shoulder: Secondary | ICD-10-CM | POA: Diagnosis not present

## 2019-07-20 DIAGNOSIS — Z4789 Encounter for other orthopedic aftercare: Secondary | ICD-10-CM | POA: Diagnosis not present

## 2019-07-25 DIAGNOSIS — M25512 Pain in left shoulder: Secondary | ICD-10-CM | POA: Diagnosis not present

## 2019-07-26 DIAGNOSIS — H33321 Round hole, right eye: Secondary | ICD-10-CM | POA: Diagnosis not present

## 2019-08-01 DIAGNOSIS — M25512 Pain in left shoulder: Secondary | ICD-10-CM | POA: Diagnosis not present

## 2019-08-08 DIAGNOSIS — M25512 Pain in left shoulder: Secondary | ICD-10-CM | POA: Diagnosis not present

## 2019-08-17 DIAGNOSIS — M25512 Pain in left shoulder: Secondary | ICD-10-CM | POA: Diagnosis not present

## 2019-08-20 DIAGNOSIS — H33311 Horseshoe tear of retina without detachment, right eye: Secondary | ICD-10-CM | POA: Diagnosis not present

## 2019-08-20 DIAGNOSIS — H25813 Combined forms of age-related cataract, bilateral: Secondary | ICD-10-CM | POA: Diagnosis not present

## 2019-08-20 DIAGNOSIS — H16223 Keratoconjunctivitis sicca, not specified as Sjogren's, bilateral: Secondary | ICD-10-CM | POA: Diagnosis not present

## 2019-08-22 ENCOUNTER — Encounter: Payer: Self-pay | Admitting: Emergency Medicine

## 2019-08-22 ENCOUNTER — Other Ambulatory Visit: Payer: Self-pay

## 2019-08-22 ENCOUNTER — Telehealth: Payer: Self-pay | Admitting: Emergency Medicine

## 2019-08-22 ENCOUNTER — Ambulatory Visit (INDEPENDENT_AMBULATORY_CARE_PROVIDER_SITE_OTHER): Payer: BC Managed Care – PPO | Admitting: Emergency Medicine

## 2019-08-22 VITALS — BP 142/70 | HR 58 | Temp 97.9°F | Ht 69.0 in | Wt 180.2 lb

## 2019-08-22 DIAGNOSIS — Z7689 Persons encountering health services in other specified circumstances: Secondary | ICD-10-CM

## 2019-08-22 DIAGNOSIS — S86911A Strain of unspecified muscle(s) and tendon(s) at lower leg level, right leg, initial encounter: Secondary | ICD-10-CM

## 2019-08-22 DIAGNOSIS — Z1321 Encounter for screening for nutritional disorder: Secondary | ICD-10-CM

## 2019-08-22 DIAGNOSIS — Z13220 Encounter for screening for lipoid disorders: Secondary | ICD-10-CM

## 2019-08-22 DIAGNOSIS — Z13228 Encounter for screening for other metabolic disorders: Secondary | ICD-10-CM

## 2019-08-22 DIAGNOSIS — Z0000 Encounter for general adult medical examination without abnormal findings: Secondary | ICD-10-CM

## 2019-08-22 DIAGNOSIS — Z130 Encounter for screening for diseases of the blood and blood-forming organs and certain disorders involving the immune mechanism: Secondary | ICD-10-CM | POA: Diagnosis not present

## 2019-08-22 DIAGNOSIS — Z1329 Encounter for screening for other suspected endocrine disorder: Secondary | ICD-10-CM | POA: Diagnosis not present

## 2019-08-22 NOTE — Telephone Encounter (Signed)
These have already been ordered  

## 2019-08-22 NOTE — Progress Notes (Signed)
Daniel Lucero 69 y.o.   Chief Complaint  Patient presents with  . Establish Care    get to know each other   . Fall    right knee pain due to stumble     HISTORY OF PRESENT ILLNESS: This is a 69 y.o. male here to establish care with me.  Used to see Dr. Nolon Rod. Active problem list reviewed with patient. Stumbled couple weeks ago and injured his right knee still with some swelling and mild pain. Has history of osteoarthritis of multiple joints, prostate enlargement, borderline hypertension. Presently taking aspirin and tamsulosin. No other complaints or medical concerns today.  HPI   Prior to Admission medications   Medication Sig Start Date End Date Taking? Authorizing Provider  aspirin 325 MG EC tablet Take 325 mg by mouth daily as needed for pain (headache).    Yes [provider]  tamsulosin (FLOMAX) 0.4 MG CAPS capsule Take 0.4 mg by mouth at bedtime.   Yes [provider]    Allergies  Allergen Reactions  . Penicillins Rash and Other (See Comments)    Has patient had a PCN reaction causing immediate rash, facial/tongue/throat swelling, SOB or lightheadedness with hypotension: Yes Has patient had a PCN reaction causing severe rash involving mucus membranes or skin necrosis: No Has patient had a PCN reaction that required hospitalization: No Has patient had a PCN reaction occurring within the last 10 years: No If all of the above answers are "NO", then may proceed with Cephalosporin use.    Patient Active Problem List   Diagnosis Date Noted  . Osteoarthritis of left shoulder 02/06/2019  . S/P shoulder replacement, left 02/06/2019  . DDD (degenerative disc disease), cervical 01/12/2018  . Basal cell carcinoma (BCC) of left side of neck 05/05/2017  . Chronic left shoulder pain 05/05/2017  . 10 year risk of MI or stroke 7.5% or greater 12/15/2016  . Primary osteoarthritis involving multiple joints 12/15/2016  . Affective personality disorder  05/21/2015  . Benign enlargement of prostate 05/21/2015  . Bipolar II disorder ([place]) 05/21/2015  . Cardiac dysrhythmia 05/21/2015  . Diverticulosis 05/21/2015  . Elevated PSA 05/21/2015  . History of tobacco use 05/21/2015  . Recurrent major depressive disorder ([place]) 05/21/2015    Past Medical History:  Diagnosis Date  . Depression   . History of kidney stones     Past Surgical History:  Procedure Laterality Date  . HERNIA REPAIR    . JOINT REPLACEMENT N/A    [employer] 08/20/2019  . TONSILLECTOMY    . TOTAL SHOULDER ARTHROPLASTY Left 02/06/2019   Procedure: TOTAL SHOULDER ARTHROPLASTY;  Surgeon: Nicholes Stairs, MD;  Location: [facility];  Service: Orthopedics;  Laterality: Left;  2.5 hrs    Social History   Socioeconomic History  . Marital status: Married    Spouse name: Not on file  . Number of children: Not on file  . Years of education: Not on file  . Highest education level: Not on file  Occupational History  . Not on file  Tobacco Use  . Smoking status: Former Smoker    Types: Cigarettes  . Smokeless tobacco: Never Used  Substance and Sexual Activity  . Alcohol use: Yes    Alcohol/week: 5.0 standard drinks    Types: 5 Cans of beer per week  . Drug use: Never  . Sexual activity: Not on file  Other Topics Concern  . Not on file  Social History Narrative  . Not on file  Social Determinants of Health   Financial Resource Strain:   . Difficulty of Paying Living Expenses:   Food Insecurity:   . Worried About Charity fundraiser in the Last Year:   . Arboriculturist in the Last Year:   Transportation Needs:   . Film/video editor (Medical):   Marland Kitchen Lack of Transportation (Non-Medical):   Physical Activity:   . Days of Exercise per Week:   . Minutes of Exercise per Session:   Stress:   . Feeling of Stress :   Social Connections:   . Frequency of Communication with Friends and Family:   . Frequency of Social Gatherings with Friends and Family:   .  Attends Religious Services:   . Active Member of Clubs or Organizations:   . Attends Archivist Meetings:   Marland Kitchen Marital Status:   Intimate Partner Violence:   . Fear of Current or Ex-Partner:   . Emotionally Abused:   Marland Kitchen Physically Abused:   . Sexually Abused:     Family History  Problem Relation Age of Onset  . Alzheimer's disease Mother   . Hypertension Father   . Heart disease Father      Review of Systems  Constitutional: Negative.  Negative for chills and fever.  HENT: Negative.  Negative for congestion and sore throat.   Respiratory: Negative.  Negative for cough and shortness of breath.   Cardiovascular: Negative.  Negative for chest pain and palpitations.  Gastrointestinal: Negative for abdominal pain, blood in stool, diarrhea, nausea and vomiting.  Genitourinary: Negative.  Negative for dysuria and hematuria.  Skin: Negative.  Negative for rash.  Neurological: Negative.  Negative for dizziness and headaches.  All other systems reviewed and are negative.  Today's Vitals   08/22/19 1331 08/22/19 1333 08/22/19 1335  BP: (!) 166/84 (!) 156/86 (!) 142/70  Pulse: (!) 58    Temp: 97.9 F (36.6 C)    TempSrc: Temporal    SpO2: 99%    Weight: 180 lb 3.2 oz (81.7 kg)    Height: 5\' 9"  (1.753 m)     Body mass index is 26.61 kg/m.   Physical Exam Vitals reviewed.  Constitutional:      Appearance: Normal appearance.  HENT:     Head: Normocephalic.  Eyes:     Extraocular Movements: Extraocular movements intact.     Pupils: Pupils are equal, round, and reactive to light.  Cardiovascular:     Rate and Rhythm: Normal rate and regular rhythm.     Pulses: Normal pulses.     Heart sounds: Normal heart sounds.  Pulmonary:     Effort: Pulmonary effort is normal.     Breath sounds: Normal breath sounds.  Musculoskeletal:     Cervical back: Normal range of motion and neck supple.     Comments: Right knee: No erythema or ecchymosis.  No localized tenderness.  Mild  swelling.  Full range of motion.  Stable in flexion and extension.  Skin:    General: Skin is warm and dry.     Capillary Refill: Capillary refill takes less than 2 seconds.  Neurological:     General: No focal deficit present.     Mental Status: He is alert and oriented to person, place, and time.  Psychiatric:        Mood and Affect: Mood normal.        Behavior: Behavior normal.      ASSESSMENT & PLAN: Saheed was seen today for transitions  of care and fall.  Diagnoses and all orders for this visit:  Knee strain, right, initial encounter  Encounter to establish care    Patient Instructions       If you have lab work done today you will be contacted with your lab results within the next 2 weeks.  If you have not heard from [place] then please contact us. The fastest way to get your results is to register for My Chart.   IF you received an x-ray today, you will receive an invoice from [facility]. Please contact [facility] at [phone] with questions or concerns regarding your invoice.   IF you received labwork today, you will receive an invoice from [facility]. Please contact [facility] at [phone] with questions or concerns regarding your invoice.   Our billing staff will not be able to assist you with questions regarding bills from these companies.  You will be contacted with the lab results as soon as they are available. The fastest way to get your results is to activate your My Chart account. Instructions are located on the last page of this paperwork. If you have not heard from [place] regarding the results in 2 weeks, please contact this office.      Health Maintenance After Age 29 After age 61, you are at a higher risk for certain long-term diseases and infections as well as injuries from falls. Falls are a major cause of broken bones and head injuries in people who are older than age 61. Getting regular preventive care can help to keep you healthy  and well. Preventive care includes getting regular testing and making lifestyle changes as recommended by your health care provider. Talk with your health care provider about:  Which screenings and tests you should have. A screening is a test that checks for a disease when you have no symptoms.  A diet and exercise plan that is right for you. What should I know about screenings and tests to prevent falls? Screening and testing are the best ways to find a health problem early. Early diagnosis and treatment give you the best chance of managing medical conditions that are common after age 43. Certain conditions and lifestyle choices may make you more likely to have a fall. Your health care provider may recommend:  Regular vision checks. Poor vision and conditions such as cataracts can make you more likely to have a fall. If you wear glasses, make sure to get your prescription updated if your vision changes.  Medicine review. Work with your health care provider to regularly review all of the medicines you are taking, including over-the-counter medicines. Ask your health care provider about any side effects that may make you more likely to have a fall. Tell your health care provider if any medicines that you take make you feel dizzy or sleepy.  Osteoporosis screening. Osteoporosis is a condition that causes the bones to get weaker. This can make the bones weak and cause them to break more easily.  Blood pressure screening. Blood pressure changes and medicines to control blood pressure can make you feel dizzy.  Strength and balance checks. Your health care provider may recommend certain tests to check your strength and balance while standing, walking, or changing positions.  Foot health exam. Foot pain and numbness, as well as not wearing proper footwear, can make you more likely to have a fall.  Depression screening. You may be more likely to have a fall if you have a fear of falling, feel  emotionally  low, or feel unable to do activities that you used to do.  Alcohol use screening. Using too much alcohol can affect your balance and may make you more likely to have a fall. What actions can I take to lower my risk of falls? General instructions  Talk with your health care provider about your risks for falling. Tell your health care provider if: ? You fall. Be sure to tell your health care provider about all falls, even ones that seem minor. ? You feel dizzy, sleepy, or off-balance.  Take over-the-counter and prescription medicines only as told by your health care provider. These include any supplements.  Eat a healthy diet and maintain a healthy weight. A healthy diet includes low-fat dairy products, low-fat (lean) meats, and fiber from whole grains, beans, and lots of fruits and vegetables. Home safety  Remove any tripping hazards, such as rugs, cords, and clutter.  Install safety equipment such as grab bars in bathrooms and safety rails on stairs.  Keep rooms and walkways well-lit. Activity   Follow a regular exercise program to stay fit. This will help you maintain your balance. Ask your health care provider what types of exercise are appropriate for you.  If you need a cane or walker, use it as recommended by your health care provider.  Wear supportive shoes that have nonskid soles. Lifestyle  Do not drink alcohol if your health care provider tells you not to drink.  If you drink alcohol, limit how much you have: ? 0-1 drink a day for women. ? 0-2 drinks a day for men.  Be aware of how much alcohol is in your drink. In the [place] one drink equals one typical bottle of beer (12 oz), one-half glass of wine (5 oz), or one shot of hard liquor (1 oz).  Do not use any products that contain nicotine or tobacco, such as cigarettes and e-cigarettes. If you need help quitting, ask your health care provider. Summary  Having a healthy lifestyle and getting preventive care can help  to protect your health and wellness after age 90.  Screening and testing are the best way to find a health problem early and help you avoid having a fall. Early diagnosis and treatment give you the best chance for managing medical conditions that are more common for people who are older than age 35.  Falls are a major cause of broken bones and head injuries in people who are older than age 61. Take precautions to prevent a fall at home.  Work with your health care provider to learn what changes you can make to improve your health and wellness and to prevent falls. This information is not intended to replace advice given to you by your health care provider. Make sure you discuss any questions you have with your health care provider. Document Revised: 06/22/2018 Document Reviewed: 01/12/2017 Elsevier Patient Education  2020 Elsevier Inc.      Agustina Caroli, MD [facility]

## 2019-08-22 NOTE — Telephone Encounter (Addendum)
08/22/2019 - PATIENT HAVING HIS ALREADY SCHEDULED COMPLETE PHYSICAL WITH DR. Kittie Plater ON Monday (09/10/2019) AT 9:20am. THIS PHONE MESSAGE IS TO HAVE LAB ORDERS PLACED. HIS NURSE'S APPOINTMENT IS ON Monday (09/03/2019) AT 8:00am. [name]

## 2019-08-22 NOTE — Patient Instructions (Addendum)
   If you have lab work done today you will be contacted with your lab results within the next 2 weeks.  If you have not heard from us then please contact us. The fastest way to get your results is to register for My Chart.   IF you received an x-ray today, you will receive an invoice from [facility]. Please contact  Radiology at [phone] with questions or concerns regarding your invoice.   IF you received labwork today, you will receive an invoice from [facility]. Please contact [facility] at [phone] with questions or concerns regarding your invoice.   Our billing staff will not be able to assist you with questions regarding bills from these companies.  You will be contacted with the lab results as soon as they are available. The fastest way to get your results is to activate your My Chart account. Instructions are located on the last page of this paperwork. If you have not heard from us regarding the results in 2 weeks, please contact this office.     Health Maintenance After Age 65 After age 65, you are at a higher risk for certain long-term diseases and infections as well as injuries from falls. Falls are a major cause of broken bones and head injuries in people who are older than age 65. Getting regular preventive care can help to keep you healthy and well. Preventive care includes getting regular testing and making lifestyle changes as recommended by your health care provider. Talk with your health care provider about:  Which screenings and tests you should have. A screening is a test that checks for a disease when you have no symptoms.  A diet and exercise plan that is right for you. What should I know about screenings and tests to prevent falls? Screening and testing are the best ways to find a health problem early. Early diagnosis and treatment give you the best chance of managing medical conditions that are common after age 65. Certain conditions and  lifestyle choices may make you more likely to have a fall. Your health care provider may recommend:  Regular vision checks. Poor vision and conditions such as cataracts can make you more likely to have a fall. If you wear glasses, make sure to get your prescription updated if your vision changes.  Medicine review. Work with your health care provider to regularly review all of the medicines you are taking, including over-the-counter medicines. Ask your health care provider about any side effects that may make you more likely to have a fall. Tell your health care provider if any medicines that you take make you feel dizzy or sleepy.  Osteoporosis screening. Osteoporosis is a condition that causes the bones to get weaker. This can make the bones weak and cause them to break more easily.  Blood pressure screening. Blood pressure changes and medicines to control blood pressure can make you feel dizzy.  Strength and balance checks. Your health care provider may recommend certain tests to check your strength and balance while standing, walking, or changing positions.  Foot health exam. Foot pain and numbness, as well as not wearing proper footwear, can make you more likely to have a fall.  Depression screening. You may be more likely to have a fall if you have a fear of falling, feel emotionally low, or feel unable to do activities that you used to do.  Alcohol use screening. Using too much alcohol can affect your balance and may make you more likely to   have a fall. What actions can I take to lower my risk of falls? General instructions  Talk with your health care provider about your risks for falling. Tell your health care provider if: ? You fall. Be sure to tell your health care provider about all falls, even ones that seem minor. ? You feel dizzy, sleepy, or off-balance.  Take over-the-counter and prescription medicines only as told by your health care provider. These include any  supplements.  Eat a healthy diet and maintain a healthy weight. A healthy diet includes low-fat dairy products, low-fat (lean) meats, and fiber from whole grains, beans, and lots of fruits and vegetables. Home safety  Remove any tripping hazards, such as rugs, cords, and clutter.  Install safety equipment such as grab bars in bathrooms and safety rails on stairs.  Keep rooms and walkways well-lit. Activity   Follow a regular exercise program to stay fit. This will help you maintain your balance. Ask your health care provider what types of exercise are appropriate for you.  If you need a cane or walker, use it as recommended by your health care provider.  Wear supportive shoes that have nonskid soles. Lifestyle  Do not drink alcohol if your health care provider tells you not to drink.  If you drink alcohol, limit how much you have: ? 0-1 drink a day for women. ? 0-2 drinks a day for men.  Be aware of how much alcohol is in your drink. In the [place] one drink equals one typical bottle of beer (12 oz), one-half glass of wine (5 oz), or one shot of hard liquor (1 oz).  Do not use any products that contain nicotine or tobacco, such as cigarettes and e-cigarettes. If you need help quitting, ask your health care provider. Summary  Having a healthy lifestyle and getting preventive care can help to protect your health and wellness after age 65.  Screening and testing are the best way to find a health problem early and help you avoid having a fall. Early diagnosis and treatment give you the best chance for managing medical conditions that are more common for people who are older than age 65.  Falls are a major cause of broken bones and head injuries in people who are older than age 65. Take precautions to prevent a fall at home.  Work with your health care provider to learn what changes you can make to improve your health and wellness and to prevent falls. This information is not intended  to replace advice given to you by your health care provider. Make sure you discuss any questions you have with your health care provider. Document Revised: 06/22/2018 Document Reviewed: 01/12/2017 Elsevier Patient Education  2020 Elsevier Inc.  

## 2019-08-31 DIAGNOSIS — M25512 Pain in left shoulder: Secondary | ICD-10-CM | POA: Diagnosis not present

## 2019-09-03 ENCOUNTER — Other Ambulatory Visit: Payer: Self-pay

## 2019-09-03 ENCOUNTER — Ambulatory Visit (INDEPENDENT_AMBULATORY_CARE_PROVIDER_SITE_OTHER): Payer: BC Managed Care – PPO | Admitting: Emergency Medicine

## 2019-09-03 DIAGNOSIS — Z13228 Encounter for screening for other metabolic disorders: Secondary | ICD-10-CM

## 2019-09-03 DIAGNOSIS — Z130 Encounter for screening for diseases of the blood and blood-forming organs and certain disorders involving the immune mechanism: Secondary | ICD-10-CM

## 2019-09-03 DIAGNOSIS — Z13220 Encounter for screening for lipoid disorders: Secondary | ICD-10-CM

## 2019-09-03 DIAGNOSIS — Z1321 Encounter for screening for nutritional disorder: Secondary | ICD-10-CM

## 2019-09-03 DIAGNOSIS — Z1329 Encounter for screening for other suspected endocrine disorder: Secondary | ICD-10-CM

## 2019-09-03 DIAGNOSIS — M25512 Pain in left shoulder: Secondary | ICD-10-CM | POA: Diagnosis not present

## 2019-09-04 LAB — LIPID PANEL
Chol/HDL Ratio: 2.4 ratio (ref 0.0–5.0)
Cholesterol, Total: 134 mg/dL (ref 100–199)
HDL: 55 mg/dL (ref >39)
LDL Chol Calc (NIH): 69 mg/dL (ref 0–99)
Triglycerides: 40 mg/dL (ref 0–149)
VLDL Cholesterol Cal: 10 mg/dL (ref 5–40)

## 2019-09-04 LAB — COMPREHENSIVE METABOLIC PANEL
ALT: 22 IU/L (ref 0–44)
AST: 25 IU/L (ref 0–40)
Albumin/Globulin Ratio: 1.7 (ref 1.2–2.2)
Albumin: 4.1 g/dL (ref 3.8–4.8)
Alkaline Phosphatase: 85 IU/L (ref 48–121)
BUN/Creatinine Ratio: 19 (ref 10–24)
BUN: 19 mg/dL (ref 8–27)
Bilirubin Total: 0.5 mg/dL (ref 0.0–1.2)
CO2: 24 mmol/L (ref 20–29)
Calcium: 9.2 mg/dL (ref 8.6–10.2)
Chloride: 104 mmol/L (ref 96–106)
Creatinine, Ser: 1.01 mg/dL (ref 0.76–1.27)
GFR calc Af Amer: 89 mL/min/{1.73_m2} (ref >59)
GFR calc non Af Amer: 77 mL/min/{1.73_m2} (ref >59)
Globulin, Total: 2.4 g/dL (ref 1.5–4.5)
Glucose: 92 mg/dL (ref 65–99)
Potassium: 4.1 mmol/L (ref 3.5–5.2)
Sodium: 142 mmol/L (ref 134–144)
Total Protein: 6.5 g/dL (ref 6.0–8.5)

## 2019-09-10 ENCOUNTER — Encounter: Payer: Self-pay | Admitting: Emergency Medicine

## 2019-09-10 ENCOUNTER — Ambulatory Visit (INDEPENDENT_AMBULATORY_CARE_PROVIDER_SITE_OTHER): Payer: BC Managed Care – PPO | Admitting: Emergency Medicine

## 2019-09-10 ENCOUNTER — Other Ambulatory Visit: Payer: Self-pay

## 2019-09-10 VITALS — BP 138/76 | HR 51 | Temp 97.7°F | Ht 69.0 in | Wt 178.2 lb

## 2019-09-10 DIAGNOSIS — Z0000 Encounter for general adult medical examination without abnormal findings: Secondary | ICD-10-CM | POA: Diagnosis not present

## 2019-09-10 NOTE — Progress Notes (Signed)
Daniel Lucero 69 y.o.   Chief Complaint  Patient presents with   Annual Exam    CPE    HISTORY OF PRESENT ILLNESS: This is a 69 y.o. male here for his annual exam. Problem list reviewed with patient. Has no complaints or medical concerns. Has BPH with LUTS.  Chronically elevated PSA.  Two negative prostate biopsies. Has chronic joint pains from osteoarthritis. CMP and lipid profile done on 09/03/2019 reviewed with patient.  Normal results. Health maintenance items reviewed with patient.  HPI   Prior to Admission medications   Medication Sig Start Date End Date Taking? Authorizing Provider  tamsulosin (FLOMAX) 0.4 MG CAPS capsule Take 0.4 mg by mouth at bedtime.   Yes [provider]  aspirin 325 MG EC tablet Take 325 mg by mouth daily as needed for pain (headache).  Patient not taking: Reported on 09/10/2019    [provider]    Allergies  Allergen Reactions   Penicillins Rash and Other (See Comments)    Has patient had a PCN reaction causing immediate rash, facial/tongue/throat swelling, SOB or lightheadedness with hypotension: Yes Has patient had a PCN reaction causing severe rash involving mucus membranes or skin necrosis: No Has patient had a PCN reaction that required hospitalization: No Has patient had a PCN reaction occurring within the last 10 years: No If all of the above answers are "NO", then may proceed with Cephalosporin use.    Patient Active Problem List   Diagnosis Date Noted   Osteoarthritis of left shoulder 02/06/2019   S/P shoulder replacement, left 02/06/2019   DDD (degenerative disc disease), cervical 01/12/2018   Basal cell carcinoma (BCC) of left side of neck 05/05/2017   Chronic left shoulder pain 05/05/2017   10 year risk of MI or stroke 7.5% or greater 12/15/2016   Primary osteoarthritis involving multiple joints 12/15/2016   Affective personality disorder 05/21/2015   Benign enlargement of prostate  05/21/2015   Bipolar II disorder ([place]) 05/21/2015   Cardiac dysrhythmia 05/21/2015   Diverticulosis 05/21/2015   Elevated PSA 05/21/2015   History of tobacco use 05/21/2015   Recurrent major depressive disorder ([place]) 05/21/2015    Past Medical History:  Diagnosis Date   Depression    History of kidney stones     Past Surgical History:  Procedure Laterality Date   HERNIA REPAIR     JOINT REPLACEMENT N/A    [employer] 08/20/2019   TONSILLECTOMY     TOTAL SHOULDER ARTHROPLASTY Left 02/06/2019   Procedure: TOTAL SHOULDER ARTHROPLASTY;  Surgeon: Nicholes Stairs, MD;  Location: [facility];  Service: Orthopedics;  Laterality: Left;  2.5 hrs    Social History   Socioeconomic History   Marital status: Married    Spouse name: Not on file   Number of children: Not on file   Years of education: Not on file   Highest education level: Not on file  Occupational History   Not on file  Tobacco Use   Smoking status: Former Smoker    Types: Cigarettes   Smokeless tobacco: Never Used  Vaping Use   Vaping Use: Never used  Substance and Sexual Activity   Alcohol use: Yes    Alcohol/week: 5.0 standard drinks    Types: 5 Cans of beer per week   Drug use: Never   Sexual activity: Not on file  Other Topics Concern   Not on file  Social History Narrative   Not on file   Social Determinants of Health  Financial Resource Strain:    Difficulty of Paying Living Expenses:   Food Insecurity:    Worried About Charity fundraiser in the Last Year:    Arboriculturist in the Last Year:   Transportation Needs:    Film/video editor (Medical):    Lack of Transportation (Non-Medical):   Physical Activity:    Days of Exercise per Week:    Minutes of Exercise per Session:   Stress:    Feeling of Stress :   Social Connections:    Frequency of Communication with Friends and Family:    Frequency of Social Gatherings with Friends and Family:    Attends  Religious Services:    Active Member of Clubs or Organizations:    Attends Music therapist:    Marital Status:   Intimate Partner Violence:    Fear of Current or Ex-Partner:    Emotionally Abused:    Physically Abused:    Sexually Abused:     Family History  Problem Relation Age of Onset   Alzheimer's disease Mother    Hypertension Father    Heart disease Father      Review of Systems  Constitutional: Negative.  Negative for chills and fever.  HENT: Negative.  Negative for congestion and sore throat.   Eyes: Negative.   Respiratory: Negative.  Negative for cough and shortness of breath.   Cardiovascular: Negative.  Negative for chest pain and palpitations.  Gastrointestinal: Negative.  Negative for abdominal pain, blood in stool, diarrhea, nausea and vomiting.  Genitourinary: Negative for dysuria, flank pain and hematuria.       Nocturia and frequency  Musculoskeletal: Positive for joint pain.  Skin: Negative.  Negative for rash.  Neurological: Negative.  Negative for dizziness and headaches.  All other systems reviewed and are negative.  Today's Vitals   09/10/19 0924 09/10/19 0929  BP: (!) 165/98 138/76  Pulse: (!) 51   Temp: 97.7 F (36.5 C)   TempSrc: Temporal   SpO2: 99%   Weight: 178 lb 3.2 oz (80.8 kg)   Height: _0  (1.753 m)    Body mass index is 26.32 kg/m.   Physical Exam Vitals reviewed.  Constitutional:      Appearance: Normal appearance.  HENT:     Head: Normocephalic.     Mouth/Throat:     Mouth: Mucous membranes are moist.     Pharynx: Oropharynx is clear.  Eyes:     Extraocular Movements: Extraocular movements intact.     Conjunctiva/sclera: Conjunctivae normal.     Pupils: Pupils are equal, round, and reactive to light.  Cardiovascular:     Rate and Rhythm: Normal rate and regular rhythm.     Pulses: Normal pulses.     Heart sounds: Normal heart sounds.  Pulmonary:     Effort: Pulmonary effort is normal.      Breath sounds: Normal breath sounds.  Abdominal:     General: Bowel sounds are normal. There is no distension.     Palpations: Abdomen is soft.     Tenderness: There is no abdominal tenderness.  Musculoskeletal:        General: Normal range of motion.     Cervical back: Normal range of motion and neck supple.     Right lower leg: No edema.     Left lower leg: No edema.  Skin:    General: Skin is warm and dry.     Capillary Refill: Capillary refill takes less than  2 seconds.  Neurological:     General: No focal deficit present.     Mental Status: He is alert and oriented to person, place, and time.  Psychiatric:        Mood and Affect: Mood normal.        Behavior: Behavior normal.    Recent Results (from the past 2160 hour(s))  Lipid panel     Status: None   Collection Time: 09/03/19  8:00 AM  Result Value Ref Range   Cholesterol, Total 134 100 - 199 mg/dL   Triglycerides 40 0 - 149 mg/dL   HDL 55 >39 mg/dL   VLDL Cholesterol Cal 10 5 - 40 mg/dL   LDL Chol Calc (NIH) 69 0 - 99 mg/dL   Chol/HDL Ratio 2.4 0.0 - 5.0 ratio    Comment:                                   T. Chol/HDL Ratio                                             Men  Women                               1/2 Avg.Risk  3.4    3.3                                   Avg.Risk  5.0    4.4                                2X Avg.Risk  9.6    7.1                                3X Avg.Risk 23.4   11.0   Comprehensive metabolic panel     Status: None   Collection Time: 09/03/19  8:00 AM  Result Value Ref Range   Glucose 92 65 - 99 mg/dL   BUN 19 8 - 27 mg/dL   Creatinine, Ser 1.01 0.76 - 1.27 mg/dL   GFR calc non Af Amer 77 >59 mL/min/1.73   GFR calc Af Amer 89 >59 mL/min/1.73    Comment: **[facility] currently reports eGFR in compliance with the current**   recommendations of the [employer]. [facility] will   update reporting as new guidelines are published from the NKF-ASN   Task force.    BUN/Creatinine  Ratio 19 10 - 24   Sodium 142 134 - 144 mmol/L   Potassium 4.1 3.5 - 5.2 mmol/L   Chloride 104 96 - 106 mmol/L   CO2 24 20 - 29 mmol/L   Calcium 9.2 8.6 - 10.2 mg/dL   Total Protein 6.5 6.0 - 8.5 g/dL   Albumin 4.1 3.8 - 4.8 g/dL   Globulin, Total 2.4 1.5 - 4.5 g/dL   Albumin/Globulin Ratio 1.7 1.2 - 2.2   Bilirubin Total 0.5 0.0 - 1.2 mg/dL   Alkaline Phosphatase 85 48 - 121 IU/L   AST 25 0 - 40 IU/L  ALT 22 0 - 44 IU/L     ASSESSMENT & PLAN: Emani was seen today for annual exam.  Diagnoses and all orders for this visit:  Routine general medical examination at a health care facility    Patient Instructions       If you have lab work done today you will be contacted with your lab results within the next 2 weeks.  If you have not heard from [place] then please contact us. The fastest way to get your results is to register for My Chart.   IF you received an x-ray today, you will receive an invoice from [facility]. Please contact [facility] at [phone] with questions or concerns regarding your invoice.   IF you received labwork today, you will receive an invoice from [facility]. Please contact [facility] at [phone] with questions or concerns regarding your invoice.   Our billing staff will not be able to assist you with questions regarding bills from these companies.  You will be contacted with the lab results as soon as they are available. The fastest way to get your results is to activate your My Chart account. Instructions are located on the last page of this paperwork. If you have not heard from [place] regarding the results in 2 weeks, please contact this office.       Health Maintenance, Male Adopting a healthy lifestyle and getting preventive care are important in promoting health and wellness. Ask your health care provider about:  The right schedule for you to have regular tests and exams.  Things you can do on your own to prevent diseases and  keep yourself healthy. What should I know about diet, weight, and exercise? Eat a healthy diet   Eat a diet that includes plenty of vegetables, fruits, low-fat dairy products, and lean protein.  Do not eat a lot of foods that are high in solid fats, added sugars, or sodium. Maintain a healthy weight Body mass index (BMI) is a measurement that can be used to identify possible weight problems. It estimates body fat based on height and weight. Your health care provider can help determine your BMI and help you achieve or maintain a healthy weight. Get regular exercise Get regular exercise. This is one of the most important things you can do for your health. Most adults should:  Exercise for at least 150 minutes each week. The exercise should increase your heart rate and make you sweat (moderate-intensity exercise).  Do strengthening exercises at least twice a week. This is in addition to the moderate-intensity exercise.  Spend less time sitting. Even light physical activity can be beneficial. Watch cholesterol and blood lipids Have your blood tested for lipids and cholesterol at 69 years of age, then have this test every 5 years. You may need to have your cholesterol levels checked more often if:  Your lipid or cholesterol levels are high.  You are older than 68 years of age.  You are at high risk for heart disease. What should I know about cancer screening? Many types of cancers can be detected early and may often be prevented. Depending on your health history and family history, you may need to have cancer screening at various ages. This may include screening for:  Colorectal cancer.  Prostate cancer.  Skin cancer.  Lung cancer. What should I know about heart disease, diabetes, and high blood pressure? Blood pressure and heart disease  High blood pressure causes heart disease and increases the risk of stroke. This  is more likely to develop in people who have high blood pressure  readings, are of African descent, or are overweight.  Talk with your health care provider about your target blood pressure readings.  Have your blood pressure checked: ? Every 3-5 years if you are 49-41 years of age. ? Every year if you are 54 years old or older.  If you are between the ages of 35 and 36 and are a current or former smoker, ask your health care provider if you should have a one-time screening for abdominal aortic aneurysm (AAA). Diabetes Have regular diabetes screenings. This checks your fasting blood sugar level. Have the screening done:  Once every three years after age 75 if you are at a normal weight and have a low risk for diabetes.  More often and at a younger age if you are overweight or have a high risk for diabetes. What should I know about preventing infection? Hepatitis B If you have a higher risk for hepatitis B, you should be screened for this virus. Talk with your health care provider to find out if you are at risk for hepatitis B infection. Hepatitis C Blood testing is recommended for:  Everyone born from [birth year] through 1965.  Anyone with known risk factors for hepatitis C. Sexually transmitted infections (STIs)  You should be screened each year for STIs, including gonorrhea and chlamydia, if: ? You are sexually active and are younger than 68 years of age. ? You are older than 68 years of age and your health care provider tells you that you are at risk for this type of infection. ? Your sexual activity has changed since you were last screened, and you are at increased risk for chlamydia or gonorrhea. Ask your health care provider if you are at risk.  Ask your health care provider about whether you are at high risk for HIV. Your health care provider may recommend a prescription medicine to help prevent HIV infection. If you choose to take medicine to prevent HIV, you should first get tested for HIV. You should then be tested every 3 months for as long as you  are taking the medicine. Follow these instructions at home: Lifestyle  Do not use any products that contain nicotine or tobacco, such as cigarettes, e-cigarettes, and chewing tobacco. If you need help quitting, ask your health care provider.  Do not use street drugs.  Do not share needles.  Ask your health care provider for help if you need support or information about quitting drugs. Alcohol use  Do not drink alcohol if your health care provider tells you not to drink.  If you drink alcohol: ? Limit how much you have to 0-2 drinks a day. ? Be aware of how much alcohol is in your drink. In the [place] one drink equals one 12 oz bottle of beer (355 mL), one 5 oz glass of wine (148 mL), or one 1 oz glass of hard liquor (44 mL). General instructions  Schedule regular health, dental, and eye exams.  Stay current with your vaccines.  Tell your health care provider if: ? You often feel depressed. ? You have ever been abused or do not feel safe at home. Summary  Adopting a healthy lifestyle and getting preventive care are important in promoting health and wellness.  Follow your health care provider's instructions about healthy diet, exercising, and getting tested or screened for diseases.  Follow your health care provider's instructions on monitoring your cholesterol and blood pressure. This  information is not intended to replace advice given to you by your health care provider. Make sure you discuss any questions you have with your health care provider. Document Revised: 02/22/2018 Document Reviewed: 02/22/2018 Elsevier Patient Education  2020 Elsevier Inc.      Agustina Caroli, MD [facility]

## 2019-09-10 NOTE — Patient Instructions (Addendum)
   If you have lab work done today you will be contacted with your lab results within the next 2 weeks.  If you have not heard from us then please contact us. The fastest way to get your results is to register for My Chart.   IF you received an x-ray today, you will receive an invoice from [facility]. Please contact [facility] at [phone] with questions or concerns regarding your invoice.   IF you received labwork today, you will receive an invoice from [facility]. Please contact [facility] at [phone] with questions or concerns regarding your invoice.   Our billing staff will not be able to assist you with questions regarding bills from these companies.  You will be contacted with the lab results as soon as they are available. The fastest way to get your results is to activate your My Chart account. Instructions are located on the last page of this paperwork. If you have not heard from us regarding the results in 2 weeks, please contact this office.      Health Maintenance, Male Adopting a healthy lifestyle and getting preventive care are important in promoting health and wellness. Ask your health care provider about:  The right schedule for you to have regular tests and exams.  Things you can do on your own to prevent diseases and keep yourself healthy. What should I know about diet, weight, and exercise? Eat a healthy diet   Eat a diet that includes plenty of vegetables, fruits, low-fat dairy products, and lean protein.  Do not eat a lot of foods that are high in solid fats, added sugars, or sodium. Maintain a healthy weight Body mass index (BMI) is a measurement that can be used to identify possible weight problems. It estimates body fat based on height and weight. Your health care provider can help determine your BMI and help you achieve or maintain a healthy weight. Get regular exercise Get regular exercise. This is one of the most important things you  can do for your health. Most adults should:  Exercise for at least 150 minutes each week. The exercise should increase your heart rate and make you sweat (moderate-intensity exercise).  Do strengthening exercises at least twice a week. This is in addition to the moderate-intensity exercise.  Spend less time sitting. Even light physical activity can be beneficial. Watch cholesterol and blood lipids Have your blood tested for lipids and cholesterol at 69 years of age, then have this test every 5 years. You may need to have your cholesterol levels checked more often if:  Your lipid or cholesterol levels are high.  You are older than 68 years of age.  You are at high risk for heart disease. What should I know about cancer screening? Many types of cancers can be detected early and may often be prevented. Depending on your health history and family history, you may need to have cancer screening at various ages. This may include screening for:  Colorectal cancer.  Prostate cancer.  Skin cancer.  Lung cancer. What should I know about heart disease, diabetes, and high blood pressure? Blood pressure and heart disease  High blood pressure causes heart disease and increases the risk of stroke. This is more likely to develop in people who have high blood pressure readings, are of African descent, or are overweight.  Talk with your health care provider about your target blood pressure readings.  Have your blood pressure checked: ? Every 3-5 years if you are 18-39   years of age. ? Every year if you are 40 years old or older.  If you are between the ages of 65 and 75 and are a current or former smoker, ask your health care provider if you should have a one-time screening for abdominal aortic aneurysm (AAA). Diabetes Have regular diabetes screenings. This checks your fasting blood sugar level. Have the screening done:  Once every three years after age 45 if you are at a normal weight and have  a low risk for diabetes.  More often and at a younger age if you are overweight or have a high risk for diabetes. What should I know about preventing infection? Hepatitis B If you have a higher risk for hepatitis B, you should be screened for this virus. Talk with your health care provider to find out if you are at risk for hepatitis B infection. Hepatitis C Blood testing is recommended for:  Everyone born from 1945 through 1965.  Anyone with known risk factors for hepatitis C. Sexually transmitted infections (STIs)  You should be screened each year for STIs, including gonorrhea and chlamydia, if: ? You are sexually active and are younger than 68 years of age. ? You are older than 68 years of age and your health care provider tells you that you are at risk for this type of infection. ? Your sexual activity has changed since you were last screened, and you are at increased risk for chlamydia or gonorrhea. Ask your health care provider if you are at risk.  Ask your health care provider about whether you are at high risk for HIV. Your health care provider may recommend a prescription medicine to help prevent HIV infection. If you choose to take medicine to prevent HIV, you should first get tested for HIV. You should then be tested every 3 months for as long as you are taking the medicine. Follow these instructions at home: Lifestyle  Do not use any products that contain nicotine or tobacco, such as cigarettes, e-cigarettes, and chewing tobacco. If you need help quitting, ask your health care provider.  Do not use street drugs.  Do not share needles.  Ask your health care provider for help if you need support or information about quitting drugs. Alcohol use  Do not drink alcohol if your health care provider tells you not to drink.  If you drink alcohol: ? Limit how much you have to 0-2 drinks a day. ? Be aware of how much alcohol is in your drink. In the [place] one drink equals one 12  oz bottle of beer (355 mL), one 5 oz glass of wine (148 mL), or one 1 oz glass of hard liquor (44 mL). General instructions  Schedule regular health, dental, and eye exams.  Stay current with your vaccines.  Tell your health care provider if: ? You often feel depressed. ? You have ever been abused or do not feel safe at home. Summary  Adopting a healthy lifestyle and getting preventive care are important in promoting health and wellness.  Follow your health care provider's instructions about healthy diet, exercising, and getting tested or screened for diseases.  Follow your health care provider's instructions on monitoring your cholesterol and blood pressure. This information is not intended to replace advice given to you by your health care provider. Make sure you discuss any questions you have with your health care provider. Document Revised: 02/22/2018 Document Reviewed: 02/22/2018 Elsevier Patient Education  2020 Elsevier Inc.  

## 2019-09-11 DIAGNOSIS — M25512 Pain in left shoulder: Secondary | ICD-10-CM | POA: Diagnosis not present

## 2019-09-14 DIAGNOSIS — H2512 Age-related nuclear cataract, left eye: Secondary | ICD-10-CM | POA: Diagnosis not present

## 2019-09-14 DIAGNOSIS — H25812 Combined forms of age-related cataract, left eye: Secondary | ICD-10-CM | POA: Diagnosis not present

## 2019-09-18 DIAGNOSIS — M25512 Pain in left shoulder: Secondary | ICD-10-CM | POA: Diagnosis not present

## 2019-09-24 DIAGNOSIS — M25512 Pain in left shoulder: Secondary | ICD-10-CM | POA: Diagnosis not present

## 2019-09-26 DIAGNOSIS — H2511 Age-related nuclear cataract, right eye: Secondary | ICD-10-CM | POA: Diagnosis not present

## 2019-09-28 DIAGNOSIS — H25811 Combined forms of age-related cataract, right eye: Secondary | ICD-10-CM | POA: Diagnosis not present

## 2019-09-28 DIAGNOSIS — H2511 Age-related nuclear cataract, right eye: Secondary | ICD-10-CM | POA: Diagnosis not present

## 2019-10-03 DIAGNOSIS — M25512 Pain in left shoulder: Secondary | ICD-10-CM | POA: Diagnosis not present

## 2019-10-16 DIAGNOSIS — R3912 Poor urinary stream: Secondary | ICD-10-CM | POA: Diagnosis not present

## 2019-10-16 DIAGNOSIS — N401 Enlarged prostate with lower urinary tract symptoms: Secondary | ICD-10-CM | POA: Diagnosis not present

## 2019-10-19 DIAGNOSIS — M13812 Other specified arthritis, left shoulder: Secondary | ICD-10-CM | POA: Diagnosis not present

## 2019-10-23 DIAGNOSIS — R3912 Poor urinary stream: Secondary | ICD-10-CM | POA: Diagnosis not present

## 2019-10-23 DIAGNOSIS — N401 Enlarged prostate with lower urinary tract symptoms: Secondary | ICD-10-CM | POA: Diagnosis not present

## 2019-10-23 DIAGNOSIS — R3914 Feeling of incomplete bladder emptying: Secondary | ICD-10-CM | POA: Diagnosis not present

## 2019-10-23 DIAGNOSIS — R972 Elevated prostate specific antigen [PSA]: Secondary | ICD-10-CM | POA: Diagnosis not present

## 2019-11-20 ENCOUNTER — Other Ambulatory Visit: Payer: Self-pay

## 2019-11-20 ENCOUNTER — Ambulatory Visit: Payer: BC Managed Care – PPO | Admitting: Dermatology

## 2019-11-20 ENCOUNTER — Encounter: Payer: Self-pay | Admitting: Dermatology

## 2019-11-20 DIAGNOSIS — D1801 Hemangioma of skin and subcutaneous tissue: Secondary | ICD-10-CM | POA: Diagnosis not present

## 2019-11-20 DIAGNOSIS — L918 Other hypertrophic disorders of the skin: Secondary | ICD-10-CM

## 2019-11-20 DIAGNOSIS — L821 Other seborrheic keratosis: Secondary | ICD-10-CM

## 2019-11-20 DIAGNOSIS — Z1283 Encounter for screening for malignant neoplasm of skin: Secondary | ICD-10-CM

## 2019-11-20 DIAGNOSIS — L570 Actinic keratosis: Secondary | ICD-10-CM

## 2019-11-20 DIAGNOSIS — L111 Transient acantholytic dermatosis [Grover]: Secondary | ICD-10-CM

## 2019-11-20 NOTE — Patient Instructions (Addendum)
General skin examination today on [name] date of birth [birth year].  From feet to scalp there is currently no atypical mole or melanoma or nonmole skin cancer.  Below the right eye is a 3 mm red dot called a cherry angioma which is harmless. he has scattered tan textured seborrheic keratoses which are also harmless.  Under the right arm is a small skin tag.  On his upper forehead he has minor diffuse solar keratoses.  Rather than choosing to freeze the spots,  we discussed the possibility of doing PDT in the late fall or winter; brochure provided.  Finally he has some breaking out on his abdomen more visible than on his back. 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  I explained this mostly affects adult white man, is not contagious, and about 70% of the time it will eventually simply disappear.  There is no FDA approved therapy for this.  The little follicular bumps on the belly could also be a component of keratosis pilaris.  Routine general skin check 1 year.

## 2019-12-10 DIAGNOSIS — R3914 Feeling of incomplete bladder emptying: Secondary | ICD-10-CM | POA: Diagnosis not present

## 2019-12-10 DIAGNOSIS — N401 Enlarged prostate with lower urinary tract symptoms: Secondary | ICD-10-CM | POA: Diagnosis not present

## 2019-12-11 ENCOUNTER — Other Ambulatory Visit: Payer: Self-pay

## 2019-12-11 ENCOUNTER — Encounter: Payer: Self-pay | Admitting: Emergency Medicine

## 2019-12-11 ENCOUNTER — Ambulatory Visit: Payer: BC Managed Care – PPO | Admitting: Emergency Medicine

## 2019-12-11 VITALS — BP 153/85 | HR 50 | Temp 98.3°F | Resp 16 | Ht 69.0 in | Wt 179.0 lb

## 2019-12-11 DIAGNOSIS — S86911A Strain of unspecified muscle(s) and tendon(s) at lower leg level, right leg, initial encounter: Secondary | ICD-10-CM

## 2019-12-11 DIAGNOSIS — M8949 Other hypertrophic osteoarthropathy, multiple sites: Secondary | ICD-10-CM

## 2019-12-11 DIAGNOSIS — H6123 Impacted cerumen, bilateral: Secondary | ICD-10-CM

## 2019-12-11 DIAGNOSIS — M159 Polyosteoarthritis, unspecified: Secondary | ICD-10-CM

## 2019-12-11 NOTE — Patient Instructions (Signed)
Earwax Buildup, Adult The ears produce a substance called earwax that helps keep bacteria out of the ear and protects the skin in the ear canal. Occasionally, earwax can build up in the ear and cause discomfort or hearing loss. What increases the risk? This condition is more likely to develop in people who:  Are male.  Are elderly.  Naturally produce more earwax.  Clean their ears often with cotton swabs.  Use earplugs often.  Use in-ear headphones often.  Wear hearing aids.  Have narrow ear canals.  Have earwax that is overly thick or sticky.  Have eczema.  Are dehydrated.  Have excess hair in the ear canal. What are the signs or symptoms? Symptoms of this condition include:  Reduced or muffled hearing.  A feeling of fullness in the ear or feeling that the ear is plugged.  Fluid coming from the ear.  Ear pain.  Ear itch.  Ringing in the ear.  Coughing.  An obvious piece of earwax that can be seen inside the ear canal. How is this diagnosed? This condition may be diagnosed based on:  Your symptoms.  Your medical history.  An ear exam. During the exam, your health care provider will look into your ear with an instrument called an otoscope. You may have tests, including a hearing test. How is this treated? This condition may be treated by:  Using ear drops to soften the earwax.  Having the earwax removed by a health care provider. The health care provider may: ? Flush the ear with water. ? Use an instrument that has a loop on the end (curette). ? Use a suction device.  Surgery to remove the wax buildup. This may be done in severe cases. Follow these instructions at home:   Take over-the-counter and prescription medicines only as told by your health care provider.  Do not put any objects, including cotton swabs, into your ear. You can clean the opening of your ear canal with a washcloth or facial tissue.  Follow instructions from your health care  provider about cleaning your ears. Do not over-clean your ears.  Drink enough fluid to keep your urine clear or pale yellow. This will help to thin the earwax.  Keep all follow-up visits as told by your health care provider. If earwax builds up in your ears often or if you use hearing aids, consider seeing your health care provider for routine, preventive ear cleanings. Ask your health care provider how often you should schedule your cleanings.  If you have hearing aids, clean them according to instructions from the manufacturer and your health care provider. Contact a health care provider if:  You have ear pain.  You develop a fever.  You have blood, pus, or other fluid coming from your ear.  You have hearing loss.  You have ringing in your ears that does not go away.  Your symptoms do not improve with treatment.  You feel like the room is spinning (vertigo). Summary  Earwax can build up in the ear and cause discomfort or hearing loss.  The most common symptoms of this condition include reduced or muffled hearing and a feeling of fullness in the ear or feeling that the ear is plugged.  This condition may be diagnosed based on your symptoms, your medical history, and an ear exam.  This condition may be treated by using ear drops to soften the earwax or by having the earwax removed by a health care provider.  Do not put any   objects, including cotton swabs, into your ear. You can clean the opening of your ear canal with a washcloth or facial tissue. This information is not intended to replace advice given to you by your health care provider. Make sure you discuss any questions you have with your health care provider. Document Revised: 02/11/2017 Document Reviewed: 05/12/2016 Elsevier Patient Education  2020 Elsevier Inc.  

## 2019-12-11 NOTE — Progress Notes (Signed)
Daniel Lucero 69 y.o.   Chief Complaint  Patient presents with  . Cerumen Impaction    per patient all jammed up   . Knee Pain    Right for about 3 weeks    HISTORY OF PRESENT ILLNESS: This is a 69 y.o. male complaining of cerumen impaction and hearing loss. Also has history of osteoarthritis of multiple joints, slightly injured right knee 3 weeks ago with subsequent pain and swelling which is now much improved. No other complaints or medical concerns today. Fully vaccinated against Covid.  HPI   Prior to Admission medications   Medication Sig Start Date End Date Taking? Authorizing Provider  aspirin 325 MG EC tablet Take 325 mg by mouth daily as needed for pain (headache).    Yes [provider]  tamsulosin (FLOMAX) 0.4 MG CAPS capsule Take 0.4 mg by mouth at bedtime.   Yes [provider]    Allergies  Allergen Reactions  . Penicillins Rash and Other (See Comments)    Has patient had a PCN reaction causing immediate rash, facial/tongue/throat swelling, SOB or lightheadedness with hypotension: Yes Has patient had a PCN reaction causing severe rash involving mucus membranes or skin necrosis: No Has patient had a PCN reaction that required hospitalization: No Has patient had a PCN reaction occurring within the last 10 years: No If all of the above answers are "NO", then may proceed with Cephalosporin use.    Patient Active Problem List   Diagnosis Date Noted  . Osteoarthritis of left shoulder 02/06/2019  . S/P shoulder replacement, left 02/06/2019  . DDD (degenerative disc disease), cervical 01/12/2018  . Basal cell carcinoma (BCC) of left side of neck 05/05/2017  . Chronic left shoulder pain 05/05/2017  . 10 year risk of MI or stroke 7.5% or greater 12/15/2016  . Primary osteoarthritis involving multiple joints 12/15/2016  . Affective personality disorder 05/21/2015  . Benign enlargement of prostate 05/21/2015  . Bipolar II disorder ([place])  05/21/2015  . Cardiac dysrhythmia 05/21/2015  . Diverticulosis 05/21/2015  . Elevated PSA 05/21/2015  . History of tobacco use 05/21/2015  . Recurrent major depressive disorder ([place]) 05/21/2015    Past Medical History:  Diagnosis Date  . Depression   . History of kidney stones     Past Surgical History:  Procedure Laterality Date  . HERNIA REPAIR    . JOINT REPLACEMENT N/A    Phreesia 08/20/2019  . TONSILLECTOMY    . TOTAL SHOULDER ARTHROPLASTY Left 02/06/2019   Procedure: TOTAL SHOULDER ARTHROPLASTY;  Surgeon: Nicholes Stairs, MD;  Location: [facility];  Service: Orthopedics;  Laterality: Left;  2.5 hrs    Social History   Socioeconomic History  . Marital status: Married    Spouse name: Not on file  . Number of children: Not on file  . Years of education: Not on file  . Highest education level: Not on file  Occupational History  . Not on file  Tobacco Use  . Smoking status: Former Smoker    Types: Cigarettes  . Smokeless tobacco: Never Used  Vaping Use  . Vaping Use: Never used  Substance and Sexual Activity  . Alcohol use: Yes    Alcohol/week: 5.0 standard drinks    Types: 5 Cans of beer per week  . Drug use: Never  . Sexual activity: Not on file  Other Topics Concern  . Not on file  Social History Narrative  . Not on file   Social Determinants of Health  Financial Resource Strain:   . Difficulty of Paying Living Expenses: Not on file  Food Insecurity:   . Worried About Charity fundraiser in the Last Year: Not on file  . Ran Out of Food in the Last Year: Not on file  Transportation Needs:   . Lack of Transportation (Medical): Not on file  . Lack of Transportation (Non-Medical): Not on file  Physical Activity:   . Days of Exercise per Week: Not on file  . Minutes of Exercise per Session: Not on file  Stress:   . Feeling of Stress : Not on file  Social Connections:   . Frequency of Communication with Friends and Family: Not on file  . Frequency  of Social Gatherings with Friends and Family: Not on file  . Attends Religious Services: Not on file  . Active Member of Clubs or Organizations: Not on file  . Attends Archivist Meetings: Not on file  . Marital Status: Not on file  Intimate Partner Violence:   . Fear of Current or Ex-Partner: Not on file  . Emotionally Abused: Not on file  . Physically Abused: Not on file  . Sexually Abused: Not on file    Family History  Problem Relation Age of Onset  . Alzheimer's disease Mother   . Hypertension Father   . Heart disease Father      Review of Systems  Constitutional: Negative.  Negative for chills and fever.  HENT: Positive for hearing loss. Negative for congestion, ear pain and sore throat.   Respiratory: Negative.  Negative for cough and shortness of breath.   Cardiovascular: Negative.  Negative for chest pain and palpitations.  Gastrointestinal: Negative.  Negative for abdominal pain, blood in stool, nausea and vomiting.  Genitourinary: Negative.  Negative for dysuria and hematuria.  Musculoskeletal: Negative for myalgias and neck pain.  Skin: Negative.  Negative for rash.  Neurological: Negative.  Negative for dizziness and headaches.  All other systems reviewed and are negative.  Today's Vitals   12/11/19 1105  BP: (!) 153/85  Pulse: (!) 50  Resp: 16  Temp: 98.3 F (36.8 C)  TempSrc: Temporal  SpO2: 97%  Weight: 179 lb (81.2 kg)  Height: 5\' 9"  (1.753 m)   Body mass index is 26.43 kg/m.   Physical Exam Vitals reviewed.  Constitutional:      Appearance: Normal appearance.  HENT:     Head: Normocephalic.     Right Ear: There is impacted cerumen.     Left Ear: There is impacted cerumen.  Eyes:     Extraocular Movements: Extraocular movements intact.     Pupils: Pupils are equal, round, and reactive to light.  Cardiovascular:     Rate and Rhythm: Normal rate.  Pulmonary:     Effort: Pulmonary effort is normal.  Musculoskeletal:     Cervical  back: Normal range of motion and neck supple.     Comments: Right knee: No erythema or ecchymosis.  No swelling.  No tenderness.  Full range of motion.  Skin:    General: Skin is warm and dry.  Neurological:     General: No focal deficit present.     Mental Status: He is alert and oriented to person, place, and time.    Ceruminosis is noted.  Wax is removed by syringing and manual debridement. Instructions for home care to prevent wax buildup are given.   ASSESSMENT & PLAN: Saket was seen today for cerumen impaction and knee pain.  Diagnoses  and all orders for this visit:  Hearing loss due to cerumen impaction, bilateral -     Ear Lavage  Knee strain, right, initial encounter  Primary osteoarthritis involving multiple joints     Patient Instructions  Earwax Buildup, Adult The ears produce a substance called earwax that helps keep bacteria out of the ear and protects the skin in the ear canal. Occasionally, earwax can build up in the ear and cause discomfort or hearing loss. What increases the risk? This condition is more likely to develop in people who:  Are male.  Are elderly.  Naturally produce more earwax.  Clean their ears often with cotton swabs.  Use earplugs often.  Use in-ear headphones often.  Wear hearing aids.  Have narrow ear canals.  Have earwax that is overly thick or sticky.  Have eczema.  Are dehydrated.  Have excess hair in the ear canal. What are the signs or symptoms? Symptoms of this condition include:  Reduced or muffled hearing.  A feeling of fullness in the ear or feeling that the ear is plugged.  Fluid coming from the ear.  Ear pain.  Ear itch.  Ringing in the ear.  Coughing.  An obvious piece of earwax that can be seen inside the ear canal. How is this diagnosed? This condition may be diagnosed based on:  Your symptoms.  Your medical history.  An ear exam. During the exam, your health care provider will look  into your ear with an instrument called an otoscope. You may have tests, including a hearing test. How is this treated? This condition may be treated by:  Using ear drops to soften the earwax.  Having the earwax removed by a health care provider. The health care provider may: ? Flush the ear with water. ? Use an instrument that has a loop on the end (curette). ? Use a suction device.  Surgery to remove the wax buildup. This may be done in severe cases. Follow these instructions at home:   Take over-the-counter and prescription medicines only as told by your health care provider.  Do not put any objects, including cotton swabs, into your ear. You can clean the opening of your ear canal with a washcloth or facial tissue.  Follow instructions from your health care provider about cleaning your ears. Do not over-clean your ears.  Drink enough fluid to keep your urine clear or pale yellow. This will help to thin the earwax.  Keep all follow-up visits as told by your health care provider. If earwax builds up in your ears often or if you use hearing aids, consider seeing your health care provider for routine, preventive ear cleanings. Ask your health care provider how often you should schedule your cleanings.  If you have hearing aids, clean them according to instructions from the manufacturer and your health care provider. Contact a health care provider if:  You have ear pain.  You develop a fever.  You have blood, pus, or other fluid coming from your ear.  You have hearing loss.  You have ringing in your ears that does not go away.  Your symptoms do not improve with treatment.  You feel like the room is spinning (vertigo). Summary  Earwax can build up in the ear and cause discomfort or hearing loss.  The most common symptoms of this condition include reduced or muffled hearing and a feeling of fullness in the ear or feeling that the ear is plugged.  This condition may be  diagnosed  based on your symptoms, your medical history, and an ear exam.  This condition may be treated by using ear drops to soften the earwax or by having the earwax removed by a health care provider.  Do not put any objects, including cotton swabs, into your ear. You can clean the opening of your ear canal with a washcloth or facial tissue. This information is not intended to replace advice given to you by your health care provider. Make sure you discuss any questions you have with your health care provider. Document Revised: 02/11/2017 Document Reviewed: 05/12/2016 Elsevier Patient Education  2020 Elsevier Inc.      Agustina Caroli, MD [facility]

## 2019-12-15 ENCOUNTER — Encounter: Payer: Self-pay | Admitting: Dermatology

## 2019-12-15 NOTE — Progress Notes (Signed)
   Follow-Up Visit   Subjective  Daniel Lucero is a 69 y.o. male who presents for the following: Annual Exam (skin check).  General skin examination Location:  Duration:  Quality:  Associated Signs/Symptoms: Modifying Factors:  Severity:  Timing: Context:   Objective  Well appearing patient in no apparent distress; mood and affect are within normal limits.  A full examination was performed including scalp, head, eyes, ears, nose, lips, neck, chest, axillae, abdomen, back, buttocks, bilateral upper extremities, bilateral lower extremities, hands, feet, fingers, toes, fingernails, and toenails. All findings within normal limits unless otherwise noted below.   Assessment & Plan  Skin cancer screening performed today.   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  To contact me if it gets worse.   General skin examination today on Daniel Lucero date of birth June 25, 1950.  From feet to scalp there is currently no atypical mole or melanoma or nonmole skin cancer.  Below the right eye is a 3 mm red dot called a cherry angioma which is harmless. he has scattered tan textured seborrheic keratoses which are also harmless.  Under the right arm is a small skin tag.  On his upper forehead he has minor diffuse solar keratoses.  Rather than choosing to freeze the spots,  we discussed the possibility of doing PDT in the late fall or winter; brochure provided.  Finally he has some breaking out on his abdomen more visible than on his back. 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  I explained this mostly affects adult white man, is not contagious, and about 70% of the time it will eventually simply disappear.  There is no FDA approved therapy for this.  The little follicular bumps on the belly could also be a component of  keratosis pilaris.  Routine general skin check 1 year.   I, Daniel Monarch, MD, have reviewed all documentation for this visit.  The documentation on 12/15/19 for the exam, diagnosis, procedures, and orders are all accurate and complete.

## 2019-12-28 ENCOUNTER — Encounter: Payer: Self-pay | Admitting: Registered Nurse

## 2019-12-28 ENCOUNTER — Other Ambulatory Visit: Payer: Self-pay

## 2019-12-28 ENCOUNTER — Ambulatory Visit: Payer: Medicare Other | Admitting: Registered Nurse

## 2019-12-28 VITALS — BP 150/89 | HR 60 | Temp 98.0°F | Resp 18 | Ht 69.0 in | Wt 180.8 lb

## 2019-12-28 DIAGNOSIS — S61214A Laceration without foreign body of right ring finger without damage to nail, initial encounter: Secondary | ICD-10-CM

## 2019-12-28 DIAGNOSIS — Z23 Encounter for immunization: Secondary | ICD-10-CM

## 2019-12-28 MED ORDER — MUPIROCIN 2 % EX OINT: 1.0000 "application " | TOPICAL_OINTMENT | Freq: Two times a day (BID) | CUTANEOUS | 0 refills | Status: DC

## 2019-12-28 NOTE — Progress Notes (Signed)
Acute Office Visit  Subjective:    Patient ID: Daniel Lucero, male    DOB: Feb 17, 1951, 69 y.o.   MRN: [id number]  Chief Complaint  Patient presents with  . Hand Pain    Patient states he cut his finger on sunday and it barely bled but now the finger has some pain and is swollen and tender to touch.    HPI Patient is in today for laceration to ring finger of R hand.  Occurred this past Sunday. Was hanging a metal window box and got cut by the metal. Scant bleeding Now is swollen, red, ttp. No streaking up arm, no drainage, no systemic symptoms  Believes that he is up to date on tetanus - maybe 5 years ago? But unsure as he got this before he transferred to our clinic.  Does note hx of penicillin allergy Also notes that he was seen by his dentist and is on clinamycin 150mg  PO tid for prophylaxis for a dental procedure.   Past Medical History:  Diagnosis Date  . Depression   . History of kidney stones     Past Surgical History:  Procedure Laterality Date  . HERNIA REPAIR    . JOINT REPLACEMENT N/A    Phreesia 08/20/2019  . TONSILLECTOMY    . TOTAL SHOULDER ARTHROPLASTY Left 02/06/2019   Procedure: TOTAL SHOULDER ARTHROPLASTY;  Surgeon: Nicholes Stairs, MD;  Location: [place];  Service: Orthopedics;  Laterality: Left;  2.5 hrs    Family History  Problem Relation Age of Onset  . Alzheimer's disease Mother   . Hypertension Father   . Heart disease Father     Social History   Socioeconomic History  . Marital status: Married    Spouse name: Not on file  . Number of children: Not on file  . Years of education: Not on file  . Highest education level: Not on file  Occupational History  . Not on file  Tobacco Use  . Smoking status: Former Smoker    Types: Cigarettes  . Smokeless tobacco: Never Used  Vaping Use  . Vaping Use: Never used  Substance and Sexual Activity  . Alcohol use: Yes    Alcohol/week: 5.0 standard drinks    Types: 5 Cans of beer per  week  . Drug use: Never  . Sexual activity: Not on file  Other Topics Concern  . Not on file  Social History Narrative  . Not on file   Social Determinants of Health   Financial Resource Strain:   . Difficulty of Paying Living Expenses: Not on file  Food Insecurity:   . Worried About Charity fundraiser in the Last Year: Not on file  . Ran Out of Food in the Last Year: Not on file  Transportation Needs:   . Lack of Transportation (Medical): Not on file  . Lack of Transportation (Non-Medical): Not on file  Physical Activity:   . Days of Exercise per Week: Not on file  . Minutes of Exercise per Session: Not on file  Stress:   . Feeling of Stress : Not on file  Social Connections:   . Frequency of Communication with Friends and Family: Not on file  . Frequency of Social Gatherings with Friends and Family: Not on file  . Attends Religious Services: Not on file  . Active Member of Clubs or Organizations: Not on file  . Attends Archivist Meetings: Not on file  . Marital Status: Not on file  Intimate Partner Violence:   . Fear of Current or Ex-Partner: Not on file  . Emotionally Abused: Not on file  . Physically Abused: Not on file  . Sexually Abused: Not on file    Outpatient Medications Prior to Visit  Medication Sig Dispense Refill  . tamsulosin (FLOMAX) 0.4 MG CAPS capsule Take 0.4 mg by mouth at bedtime.    Marland Kitchen aspirin 325 MG EC tablet Take 325 mg by mouth daily as needed for pain (headache).  (Patient not taking: Reported on 12/28/2019)     No facility-administered medications prior to visit.    Allergies  Allergen Reactions  . Penicillins Rash and Other (See Comments)    Has patient had a PCN reaction causing immediate rash, facial/tongue/throat swelling, SOB or lightheadedness with hypotension: Yes Has patient had a PCN reaction causing severe rash involving mucus membranes or skin necrosis: No Has patient had a PCN reaction that required hospitalization:  No Has patient had a PCN reaction occurring within the last 10 years: No If all of the above answers are "NO", then may proceed with Cephalosporin use.    Review of Systems Per hpi      Objective:    Physical Exam Vitals and nursing note reviewed.  Constitutional:      Appearance: Normal appearance.  Cardiovascular:     Rate and Rhythm: Normal rate and regular rhythm.  Pulmonary:     Effort: Pulmonary effort is normal. No respiratory distress.  Skin:    General: Skin is warm and dry.     Capillary Refill: Capillary refill takes less than 2 seconds.     Findings: Lesion (2cm laceration across proximal portion of ring finger of R hand. redness and swelling locally. no drainage.) present.  Neurological:     General: No focal deficit present.     Mental Status: He is alert and oriented to person, place, and time. Mental status is at baseline.  Psychiatric:        Mood and Affect: Mood normal.        Behavior: Behavior normal.        Thought Content: Thought content normal.        Judgment: Judgment normal.     BP (!) 150/89   Pulse 60   Temp 98 F (36.7 C) (Temporal)   Resp 18   Ht 5\' 9"  (1.753 m)   Wt 180 lb 12.8 oz (82 kg)   SpO2 100%   BMI 26.70 kg/m  Wt Readings from Last 3 Encounters:  12/28/19 180 lb 12.8 oz (82 kg)  12/11/19 179 lb (81.2 kg)  09/10/19 178 lb 3.2 oz (80.8 kg)    There are no preventive care reminders to display for this patient.  There are no preventive care reminders to display for this patient.   No results found for: TSH Lab Results  Component Value Date   WBC 5.5 02/02/2019   HGB 15.1 02/02/2019   HCT 43.3 02/02/2019   MCV 91.2 02/02/2019   PLT 211 02/02/2019   Lab Results  Component Value Date   NA 142 09/03/2019   K 4.1 09/03/2019   CO2 24 09/03/2019   GLUCOSE 92 09/03/2019   BUN 19 09/03/2019   CREATININE 1.01 09/03/2019   BILITOT 0.5 09/03/2019   ALKPHOS 85 09/03/2019   AST 25 09/03/2019   ALT 22 09/03/2019   PROT  6.5 09/03/2019   ALBUMIN 4.1 09/03/2019   CALCIUM 9.2 09/03/2019   ANIONGAP 10 02/02/2019   Lab  Results  Component Value Date   CHOL 134 09/03/2019   Lab Results  Component Value Date   HDL 55 09/03/2019   Lab Results  Component Value Date   LDLCALC 69 09/03/2019   Lab Results  Component Value Date   TRIG 40 09/03/2019   Lab Results  Component Value Date   CHOLHDL 2.4 09/03/2019   No results found for: HGBA1C     Assessment & Plan:   Problem List Items Addressed This Visit    None    Visit Diagnoses    Laceration of right ring finger without foreign body without damage to nail, initial encounter    -  Primary   Relevant Medications   mupirocin ointment (BACTROBAN) 2 %   Other Relevant Orders   Tdap vaccine greater than or equal to 7yo IM       Meds ordered this encounter  Medications  . mupirocin ointment (BACTROBAN) 2 %    Sig: Apply 1 application topically 2 (two) times daily.    Dispense:  22 g    Refill:  0    Order Specific Question:   Supervising Provider    Answer:   Carlota Raspberry, JEFFREY R [[id number]]   PLAN  tdap booster for dirty wound  Continue clindamycin. Will send mupirocin  Discussed reasons to return to clinic, patient voices understanding  Patient encouraged to call clinic with any questions, comments, or concerns.   Maximiano Coss, NP

## 2019-12-28 NOTE — Patient Instructions (Signed)
° ° ° °  If you have lab work done today you will be contacted with your lab results within the next 2 weeks.  If you have not heard from us then please contact us. The fastest way to get your results is to register for My Chart. ° ° °IF you received an x-ray today, you will receive an invoice from [facility]. Please contact [facility] at [phone] with questions or concerns regarding your invoice.  ° °IF you received labwork today, you will receive an invoice from [facility]. Please contact [facility] at [phone] with questions or concerns regarding your invoice.  ° °Our billing staff will not be able to assist you with questions regarding bills from these companies. ° °You will be contacted with the lab results as soon as they are available. The fastest way to get your results is to activate your My Chart account. Instructions are located on the last page of this paperwork. If you have not heard from us regarding the results in 2 weeks, please contact this office. °  ° ° ° °

## 2019-12-31 ENCOUNTER — Telehealth: Payer: Self-pay | Admitting: Emergency Medicine

## 2019-12-31 ENCOUNTER — Other Ambulatory Visit: Payer: Self-pay | Admitting: *Deleted

## 2019-12-31 DIAGNOSIS — S61214A Laceration without foreign body of right ring finger without damage to nail, initial encounter: Secondary | ICD-10-CM

## 2019-12-31 MED ORDER — MUPIROCIN 2 % EX OINT: 1.0000 "application " | TOPICAL_OINTMENT | Freq: Two times a day (BID) | CUTANEOUS | 0 refills | Status: DC

## 2019-12-31 NOTE — Telephone Encounter (Signed)
Bactroban 2% was resent because computer was down at the [facility] on Friday. Thank you

## 2019-12-31 NOTE — Telephone Encounter (Signed)
Pt is needing this medication listed below System was down looks like medication was sent in to pharmacy on friday 12/28/19 but pharmacy doesn't have it due to sytem down on there end, so it is needed to be resent in.   mupirocin ointment (BACTROBAN) 2 % [[id number]]   [place], [place].  Please advise.

## 2020-01-02 ENCOUNTER — Ambulatory Visit: Payer: BC Managed Care – PPO | Admitting: Emergency Medicine

## 2020-01-02 ENCOUNTER — Other Ambulatory Visit: Payer: Self-pay

## 2020-01-02 ENCOUNTER — Encounter: Payer: Self-pay | Admitting: Emergency Medicine

## 2020-01-02 VITALS — BP 118/70 | HR 71 | Temp 98.0°F | Resp 16 | Ht 69.0 in | Wt 179.0 lb

## 2020-01-02 DIAGNOSIS — L089 Local infection of the skin and subcutaneous tissue, unspecified: Secondary | ICD-10-CM | POA: Diagnosis not present

## 2020-01-02 MED ORDER — CIPROFLOXACIN HCL 500 MG PO TABS: 500.0000 mg | ORAL_TABLET | Freq: Two times a day (BID) | ORAL | 0 refills | Status: AC

## 2020-01-02 MED ORDER — CIPROFLOXACIN HCL 500 MG PO TABS: 500.0000 mg | ORAL_TABLET | Freq: Two times a day (BID) | ORAL | 0 refills | Status: DC

## 2020-01-02 NOTE — Progress Notes (Signed)
Daniel Lucero 69 y.o.   Chief Complaint  Patient presents with  . Hand Pain    follow up Right 3rd finger and not feeling better unable to bend    HISTORY OF PRESENT ILLNESS: This is a 69 y.o. male complaining of right middle finger infection that started after small laceration. Took clindamycin for 4 days without significant results.  Was also applying Bactroban to the area. Complaining of increased swelling and redness.  Has difficulty bending the finger.  HPI   Prior to Admission medications   Medication Sig Start Date End Date Taking? Authorizing Provider  aspirin 325 MG EC tablet Take 325 mg by mouth daily as needed for pain (headache).    Yes [provider]  mupirocin ointment (BACTROBAN) 2 % Apply 1 application topically 2 (two) times daily. 12/31/19  Yes Maximiano Coss, NP  tamsulosin (FLOMAX) 0.4 MG CAPS capsule Take 0.4 mg by mouth at bedtime.   Yes [provider]  ciprofloxacin (CIPRO) 500 MG tablet Take 1 tablet (500 mg total) by mouth 2 (two) times daily for 7 days. 01/02/20 01/09/20  Horald Pollen, MD    Allergies  Allergen Reactions  . Penicillins Rash and Other (See Comments)    Has patient had a PCN reaction causing immediate rash, facial/tongue/throat swelling, SOB or lightheadedness with hypotension: Yes Has patient had a PCN reaction causing severe rash involving mucus membranes or skin necrosis: No Has patient had a PCN reaction that required hospitalization: No Has patient had a PCN reaction occurring within the last 10 years: No If all of the above answers are "NO", then may proceed with Cephalosporin use.    Patient Active Problem List   Diagnosis Date Noted  . Osteoarthritis of left shoulder 02/06/2019  . S/P shoulder replacement, left 02/06/2019  . DDD (degenerative disc disease), cervical 01/12/2018  . Basal cell carcinoma (BCC) of left side of neck 05/05/2017  . Chronic left shoulder pain 05/05/2017  . 10 year  risk of MI or stroke 7.5% or greater 12/15/2016  . Primary osteoarthritis involving multiple joints 12/15/2016  . Affective personality disorder 05/21/2015  . Benign enlargement of prostate 05/21/2015  . Bipolar II disorder ([place]) 05/21/2015  . Cardiac dysrhythmia 05/21/2015  . Diverticulosis 05/21/2015  . Elevated PSA 05/21/2015  . History of tobacco use 05/21/2015  . Recurrent major depressive disorder ([place]) 05/21/2015    Past Medical History:  Diagnosis Date  . Depression   . History of kidney stones     Past Surgical History:  Procedure Laterality Date  . HERNIA REPAIR    . JOINT REPLACEMENT N/A    [employer]reesia 08/20/2019  . TONSILLECTOMY    . TOTAL SHOULDER ARTHROPLASTY Left 02/06/2019   Procedure: TOTAL SHOULDER ARTHROPLASTY;  Surgeon: Nicholes Stairs, MD;  Location: [place];  Service: Orthopedics;  Laterality: Left;  2.5 hrs    Social History   Socioeconomic History  . Marital status: Married    Spouse name: Not on file  . Number of children: Not on file  . Years of education: Not on file  . Highest education level: Not on file  Occupational History  . Not on file  Tobacco Use  . Smoking status: Former Smoker    Types: Cigarettes  . Smokeless tobacco: Never Used  Vaping Use  . Vaping Use: Never used  Substance and Sexual Activity  . Alcohol use: Yes    Alcohol/week: 5.0 standard drinks    Types: 5 Cans of beer per week  .  Drug use: Never  . Sexual activity: Not on file  Other Topics Concern  . Not on file  Social History Narrative  . Not on file   Social Determinants of Health   Financial Resource Strain:   . Difficulty of Paying Living Expenses: Not on file  Food Insecurity:   . Worried About Charity fundraiser in the Last Year: Not on file  . Ran Out of Food in the Last Year: Not on file  Transportation Needs:   . Lack of Transportation (Medical): Not on file  . Lack of Transportation (Non-Medical): Not on file  Physical Activity:   . Days of  Exercise per Week: Not on file  . Minutes of Exercise per Session: Not on file  Stress:   . Feeling of Stress : Not on file  Social Connections:   . Frequency of Communication with Friends and Family: Not on file  . Frequency of Social Gatherings with Friends and Family: Not on file  . Attends Religious Services: Not on file  . Active Member of Clubs or Organizations: Not on file  . Attends Archivist Meetings: Not on file  . Marital Status: Not on file  Intimate Partner Violence:   . Fear of Current or Ex-Partner: Not on file  . Emotionally Abused: Not on file  . Physically Abused: Not on file  . Sexually Abused: Not on file    Family History  Problem Relation Age of Onset  . Alzheimer's disease Mother   . Hypertension Father   . Heart disease Father      ROS   Physical Exam Vitals reviewed.  Constitutional:      Appearance: Normal appearance.  HENT:     Head: Normocephalic.  Eyes:     Extraocular Movements: Extraocular movements intact.     Pupils: Pupils are equal, round, and reactive to light.  Cardiovascular:     Rate and Rhythm: Normal rate.  Pulmonary:     Effort: Pulmonary effort is normal.  Musculoskeletal:     Cervical back: Normal range of motion.  Skin:    Capillary Refill: Capillary refill takes less than 2 seconds.     Comments: Right middle finger: Positive swelling and redness with decreased range of motion.  Neurological:     General: No focal deficit present.     Mental Status: He is alert and oriented to person, place, and time.  Psychiatric:        Mood and Affect: Mood normal.        Behavior: Behavior normal.          ASSESSMENT & PLAN: Daniel Lucero was seen today for hand pain.  Diagnoses and all orders for this visit:  Finger infection -     Ambulatory referral to Hand Surgery -     Discontinue: ciprofloxacin (CIPRO) 500 MG tablet; Take 1 tablet (500 mg total) by mouth 2 (two) times daily for 7 days. -     ciprofloxacin  (CIPRO) 500 MG tablet; Take 1 tablet (500 mg total) by mouth 2 (two) times daily for 7 days.    Patient Instructions   Take antibiotic Cipro as prescribed. Follow-up with hand surgeon this week.    If you have lab work done today you will be contacted with your lab results within the next 2 weeks.  If you have not heard from [place] then please contact us. The fastest way to get your results is to register for My Chart.   IF  you received an x-ray today, you will receive an invoice from [facility]. Please contact [facility] at [phone] with questions or concerns regarding your invoice.   IF you received labwork today, you will receive an invoice from [facility]. Please contact [facility] at [phone] with questions or concerns regarding your invoice.   Our billing staff will not be able to assist you with questions regarding bills from these companies.  You will be contacted with the lab results as soon as they are available. The fastest way to get your results is to activate your My Chart account. Instructions are located on the last page of this paperwork. If you have not heard from [place] regarding the results in 2 weeks, please contact this office.     Health Maintenance After Age 71 After age 2, you are at a higher risk for certain long-term diseases and infections as well as injuries from falls. Falls are a major cause of broken bones and head injuries in people who are older than age 76. Getting regular preventive care can help to keep you healthy and well. Preventive care includes getting regular testing and making lifestyle changes as recommended by your health care provider. Talk with your health care provider about:  Which screenings and tests you should have. A screening is a test that checks for a disease when you have no symptoms.  A diet and exercise plan that is right for you. What should I know about screenings and tests to prevent falls? Screening and  testing are the best ways to find a health problem early. Early diagnosis and treatment give you the best chance of managing medical conditions that are common after age 33. Certain conditions and lifestyle choices may make you more likely to have a fall. Your health care provider may recommend:  Regular vision checks. Poor vision and conditions such as cataracts can make you more likely to have a fall. If you wear glasses, make sure to get your prescription updated if your vision changes.  Medicine review. Work with your health care provider to regularly review all of the medicines you are taking, including over-the-counter medicines. Ask your health care provider about any side effects that may make you more likely to have a fall. Tell your health care provider if any medicines that you take make you feel dizzy or sleepy.  Osteoporosis screening. Osteoporosis is a condition that causes the bones to get weaker. This can make the bones weak and cause them to break more easily.  Blood pressure screening. Blood pressure changes and medicines to control blood pressure can make you feel dizzy.  Strength and balance checks. Your health care provider may recommend certain tests to check your strength and balance while standing, walking, or changing positions.  Foot health exam. Foot pain and numbness, as well as not wearing proper footwear, can make you more likely to have a fall.  Depression screening. You may be more likely to have a fall if you have a fear of falling, feel emotionally low, or feel unable to do activities that you used to do.  Alcohol use screening. Using too much alcohol can affect your balance and may make you more likely to have a fall. What actions can I take to lower my risk of falls? General instructions  Talk with your health care provider about your risks for falling. Tell your health care provider if: ? You fall. Be sure to tell your health care provider about all falls,  even ones  that seem minor. ? You feel dizzy, sleepy, or off-balance.  Take over-the-counter and prescription medicines only as told by your health care provider. These include any supplements.  Eat a healthy diet and maintain a healthy weight. A healthy diet includes low-fat dairy products, low-fat (lean) meats, and fiber from whole grains, beans, and lots of fruits and vegetables. Home safety  Remove any tripping hazards, such as rugs, cords, and clutter.  Install safety equipment such as grab bars in bathrooms and safety rails on stairs.  Keep rooms and walkways well-lit. Activity   Follow a regular exercise program to stay fit. This will help you maintain your balance. Ask your health care provider what types of exercise are appropriate for you.  If you need a cane or walker, use it as recommended by your health care provider.  Wear supportive shoes that have nonskid soles. Lifestyle  Do not drink alcohol if your health care provider tells you not to drink.  If you drink alcohol, limit how much you have: ? 0-1 drink a day for women. ? 0-2 drinks a day for men.  Be aware of how much alcohol is in your drink. In the [place] one drink equals one typical bottle of beer (12 oz), one-half glass of wine (5 oz), or one shot of hard liquor (1 oz).  Do not use any products that contain nicotine or tobacco, such as cigarettes and e-cigarettes. If you need help quitting, ask your health care provider. Summary  Having a healthy lifestyle and getting preventive care can help to protect your health and wellness after age 81.  Screening and testing are the best way to find a health problem early and help you avoid having a fall. Early diagnosis and treatment give you the best chance for managing medical conditions that are more common for people who are older than age 4.  Falls are a major cause of broken bones and head injuries in people who are older than age 13. Take precautions to  prevent a fall at home.  Work with your health care provider to learn what changes you can make to improve your health and wellness and to prevent falls. This information is not intended to replace advice given to you by your health care provider. Make sure you discuss any questions you have with your health care provider. Document Revised: 06/22/2018 Document Reviewed: 01/12/2017 Elsevier Patient Education  2020 Elsevier Inc.      Agustina Caroli, MD [facility]

## 2020-01-02 NOTE — Patient Instructions (Addendum)
Take antibiotic Cipro as prescribed. Follow-up with hand surgeon this week.    If you have lab work done today you will be contacted with your lab results within the next 2 weeks.  If you have not heard from [place] then please contact us. The fastest way to get your results is to register for My Chart.   IF you received an x-ray today, you will receive an invoice from [facility]. Please contact [facility] at [phone] with questions or concerns regarding your invoice.   IF you received labwork today, you will receive an invoice from [facility]. Please contact [facility] at [phone] with questions or concerns regarding your invoice.   Our billing staff will not be able to assist you with questions regarding bills from these companies.  You will be contacted with the lab results as soon as they are available. The fastest way to get your results is to activate your My Chart account. Instructions are located on the last page of this paperwork. If you have not heard from [place] regarding the results in 2 weeks, please contact this office.     Health Maintenance After Age 27 After age 19, you are at a higher risk for certain long-term diseases and infections as well as injuries from falls. Falls are a major cause of broken bones and head injuries in people who are older than age 14. Getting regular preventive care can help to keep you healthy and well. Preventive care includes getting regular testing and making lifestyle changes as recommended by your health care provider. Talk with your health care provider about:  Which screenings and tests you should have. A screening is a test that checks for a disease when you have no symptoms.  A diet and exercise plan that is right for you. What should I know about screenings and tests to prevent falls? Screening and testing are the best ways to find a health problem early. Early diagnosis and treatment give you the best chance of managing  medical conditions that are common after age 79. Certain conditions and lifestyle choices may make you more likely to have a fall. Your health care provider may recommend:  Regular vision checks. Poor vision and conditions such as cataracts can make you more likely to have a fall. If you wear glasses, make sure to get your prescription updated if your vision changes.  Medicine review. Work with your health care provider to regularly review all of the medicines you are taking, including over-the-counter medicines. Ask your health care provider about any side effects that may make you more likely to have a fall. Tell your health care provider if any medicines that you take make you feel dizzy or sleepy.  Osteoporosis screening. Osteoporosis is a condition that causes the bones to get weaker. This can make the bones weak and cause them to break more easily.  Blood pressure screening. Blood pressure changes and medicines to control blood pressure can make you feel dizzy.  Strength and balance checks. Your health care provider may recommend certain tests to check your strength and balance while standing, walking, or changing positions.  Foot health exam. Foot pain and numbness, as well as not wearing proper footwear, can make you more likely to have a fall.  Depression screening. You may be more likely to have a fall if you have a fear of falling, feel emotionally low, or feel unable to do activities that you used to do.  Alcohol use screening. Using too much alcohol  can affect your balance and may make you more likely to have a fall. What actions can I take to lower my risk of falls? General instructions  Talk with your health care provider about your risks for falling. Tell your health care provider if: ? You fall. Be sure to tell your health care provider about all falls, even ones that seem minor. ? You feel dizzy, sleepy, or off-balance.  Take over-the-counter and prescription medicines only  as told by your health care provider. These include any supplements.  Eat a healthy diet and maintain a healthy weight. A healthy diet includes low-fat dairy products, low-fat (lean) meats, and fiber from whole grains, beans, and lots of fruits and vegetables. Home safety  Remove any tripping hazards, such as rugs, cords, and clutter.  Install safety equipment such as grab bars in bathrooms and safety rails on stairs.  Keep rooms and walkways well-lit. Activity   Follow a regular exercise program to stay fit. This will help you maintain your balance. Ask your health care provider what types of exercise are appropriate for you.  If you need a cane or walker, use it as recommended by your health care provider.  Wear supportive shoes that have nonskid soles. Lifestyle  Do not drink alcohol if your health care provider tells you not to drink.  If you drink alcohol, limit how much you have: ? 0-1 drink a day for women. ? 0-2 drinks a day for men.  Be aware of how much alcohol is in your drink. In the [place] one drink equals one typical bottle of beer (12 oz), one-half glass of wine (5 oz), or one shot of hard liquor (1 oz).  Do not use any products that contain nicotine or tobacco, such as cigarettes and e-cigarettes. If you need help quitting, ask your health care provider. Summary  Having a healthy lifestyle and getting preventive care can help to protect your health and wellness after age 42.  Screening and testing are the best way to find a health problem early and help you avoid having a fall. Early diagnosis and treatment give you the best chance for managing medical conditions that are more common for people who are older than age 54.  Falls are a major cause of broken bones and head injuries in people who are older than age 71. Take precautions to prevent a fall at home.  Work with your health care provider to learn what changes you can make to improve your health and wellness  and to prevent falls. This information is not intended to replace advice given to you by your health care provider. Make sure you discuss any questions you have with your health care provider. Document Revised: 06/22/2018 Document Reviewed: 01/12/2017 Elsevier Patient Education  2020 [employer].

## 2020-01-03 ENCOUNTER — Ambulatory Visit: Payer: Self-pay

## 2020-01-03 ENCOUNTER — Ambulatory Visit: Payer: BC Managed Care – PPO | Admitting: Orthopedic Surgery

## 2020-01-03 ENCOUNTER — Encounter: Payer: Self-pay | Admitting: Physician Assistant

## 2020-01-03 VITALS — Ht 69.0 in | Wt 179.0 lb

## 2020-01-03 DIAGNOSIS — M79644 Pain in right finger(s): Secondary | ICD-10-CM

## 2020-01-03 NOTE — Progress Notes (Signed)
Office Visit Note   Patient: Daniel Lucero [facility]           Date of Birth: 1950-08-03           MRN: [id number] Visit Date: 01/03/2020              Requested by: Horald Pollen, MD [place],  [place] [address] PCP: Horald Pollen, MD  Chief Complaint  Patient presents with  . Right Hand - Pain    Middle finger pain       HPI: Patient is a 69 year old gentleman who is seen for initial evaluation for a volar laceration to the right long finger at the flexor crease of the PIP joint.  Patient had episodes of pain and swelling he was initially on an antibiotic and this was changed to Cipro.  Patient states he also received a tetanus shot.  Patient states that after the antibiotics the pain and swelling is resolving but he states he still has stiffness.  Assessment & Plan: Visit Diagnoses:  1. Pain of right middle finger     Plan: We will have him continue the Cipro recommended heat and passive range of motion of the finger discussed that if he has increased pain he would need to go the emergency room for evaluation for surgical debridement otherwise I will see him on Monday.  Follow-Up Instructions: Return in about 1 week (around 01/10/2020).   Ortho Exam  Patient is alert, oriented, no adenopathy, well-dressed, normal affect, normal respiratory effort. Examination the right hand is neurovascular intact patient describes the laceration over the palmar aspect of the PIP joint as being superficial with no bleeding.  Patient states there was no exposed soft tissue or tendons.  Patient does have swelling around the proximal phalanx of the long finger there is no pain with gentle passive range of motion of the finger however he does have decreased range of motion with flexion to within 2 fingerbreadths of the distal palmar crease.  There is no ascending cellulitis no open wounds.  No clinical signs of flexor tenosynovitis.  Imaging: XR Finger Middle Right  Result  Date: 01/03/2020 Three-view radiographs of the left long finger still shows soft tissue swelling no gas no bony changes  No images are attached to the encounter.  Labs: No results found for: HGBA1C, ESRSEDRATE, CRP, LABURIC, REPTSTATUS, GRAMSTAIN, CULT, LABORGA   Lab Results  Component Value Date   ALBUMIN 4.1 09/03/2019    No results found for: MG No results found for: VD25OH  No results found for: PREALBUMIN CBC EXTENDED Latest Ref Rng & Units 02/02/2019  WBC 4.0 - 10.5 K/uL 5.5  RBC 4.22 - 5.81 MIL/uL 4.75  HGB 13.0 - 17.0 g/dL 15.1  HCT 39 - 52 % 43.3  PLT 150 - 400 K/uL 211     Body mass index is 26.43 kg/m.  Orders:  Orders Placed This Encounter  Procedures  . XR Finger Middle Right   No orders of the defined types were placed in this encounter.    Procedures: No procedures performed  Clinical Data: No additional findings.  ROS:  All other systems negative, except as noted in the HPI. Review of Systems  Objective: Vital Signs: Ht 5\' 9"  (1.753 m)   Wt 179 lb (81.2 kg)   BMI 26.43 kg/m   Specialty Comments:  No specialty comments available.  PMFS History: Patient Active Problem List   Diagnosis Date Noted  . Osteoarthritis of left shoulder 02/06/2019  .  S/P shoulder replacement, left 02/06/2019  . DDD (degenerative disc disease), cervical 01/12/2018  . Basal cell carcinoma (BCC) of left side of neck 05/05/2017  . Chronic left shoulder pain 05/05/2017  . 10 year risk of MI or stroke 7.5% or greater 12/15/2016  . Primary osteoarthritis involving multiple joints 12/15/2016  . Affective personality disorder 05/21/2015  . Benign enlargement of prostate 05/21/2015  . Bipolar II disorder ([place]) 05/21/2015  . Cardiac dysrhythmia 05/21/2015  . Diverticulosis 05/21/2015  . Elevated PSA 05/21/2015  . History of tobacco use 05/21/2015  . Recurrent major depressive disorder ([place]) 05/21/2015   Past Medical History:  Diagnosis Date  . Depression   .  History of kidney stones     Family History  Problem Relation Age of Onset  . Alzheimer's disease Mother   . Hypertension Father   . Heart disease Father     Past Surgical History:  Procedure Laterality Date  . HERNIA REPAIR    . JOINT REPLACEMENT N/A    Phreesia 08/20/2019  . TONSILLECTOMY    . TOTAL SHOULDER ARTHROPLASTY Left 02/06/2019   Procedure: TOTAL SHOULDER ARTHROPLASTY;  Surgeon: Nicholes Stairs, MD;  Location: [facility];  Service: Orthopedics;  Laterality: Left;  2.5 hrs   Social History   Occupational History  . Not on file  Tobacco Use  . Smoking status: Former Smoker    Types: Cigarettes  . Smokeless tobacco: Never Used  Vaping Use  . Vaping Use: Never used  Substance and Sexual Activity  . Alcohol use: Yes    Alcohol/week: 5.0 standard drinks    Types: 5 Cans of beer per week  . Drug use: Never  . Sexual activity: Not on file

## 2020-01-04 ENCOUNTER — Ambulatory Visit (INDEPENDENT_AMBULATORY_CARE_PROVIDER_SITE_OTHER): Payer: BC Managed Care – PPO | Admitting: Family Medicine

## 2020-01-04 ENCOUNTER — Other Ambulatory Visit: Payer: Self-pay

## 2020-01-04 ENCOUNTER — Encounter: Payer: Self-pay | Admitting: Family Medicine

## 2020-01-04 DIAGNOSIS — Z23 Encounter for immunization: Secondary | ICD-10-CM | POA: Diagnosis not present

## 2020-01-04 NOTE — Progress Notes (Signed)
Lab only visit 

## 2020-01-07 ENCOUNTER — Ambulatory Visit (INDEPENDENT_AMBULATORY_CARE_PROVIDER_SITE_OTHER): Payer: BC Managed Care – PPO | Admitting: Orthopedic Surgery

## 2020-01-07 ENCOUNTER — Other Ambulatory Visit: Payer: Self-pay

## 2020-01-07 ENCOUNTER — Encounter: Payer: Self-pay | Admitting: Orthopedic Surgery

## 2020-01-07 VITALS — Ht 69.0 in | Wt 179.0 lb

## 2020-01-07 DIAGNOSIS — L03011 Cellulitis of right finger: Secondary | ICD-10-CM

## 2020-01-07 NOTE — Progress Notes (Signed)
Office Visit Note   Patient: Daniel Lucero [facility]           Date of Birth: 1950-08-03           MRN: [id number] Visit Date: 01/07/2020              Requested by: Horald Pollen, MD [place],  [place] [address] PCP: Horald Pollen, MD  Chief Complaint  Patient presents with  . Right Middle Finger - Follow-up      RSW:NIOEVOJ is a 69 year old gentleman who presents in follow-up for cellulitis right long finger he has been on Cipro he states the finger is doing better he has better range of motion he states the swelling has resolved and the redness has resolved.   Assessment & Plan: Visit Diagnoses:  1. Cellulitis of right middle finger     Plan: Patient will complete his Cipro recommended paraffin baths and actively working on regaining full range of motion passively and actively.  Follow-Up Instructions: Return if symptoms worsen or fail to improve.   Ortho Exam  Patient is alert, oriented, no adenopathy, well-dressed, normal affect, normal respiratory effort. Examination patient has regained almost full range of motion of the right long finger he can passively bring it to the distal palmar crease.  The swelling has resolved there is no pain with active range of motion there is no tenderness to palpation over the flexor tendons.  There is no open wound no cellulitis.  Imaging: No results found. No images are attached to the encounter.  Labs: No results found for: HGBA1C, ESRSEDRATE, CRP, LABURIC, REPTSTATUS, GRAMSTAIN, CULT, LABORGA   Lab Results  Component Value Date   ALBUMIN 4.1 09/03/2019    No results found for: MG No results found for: VD25OH  No results found for: PREALBUMIN CBC EXTENDED Latest Ref Rng & Units 02/02/2019  WBC 4.0 - 10.5 K/uL 5.5  RBC 4.22 - 5.81 MIL/uL 4.75  HGB 13.0 - 17.0 g/dL 15.1  HCT 39 - 52 % 43.3  PLT 150 - 400 K/uL 211     Body mass index is 26.43 kg/m.  Orders:  No orders of the defined types were  placed in this encounter.  No orders of the defined types were placed in this encounter.    Procedures: No procedures performed  Clinical Data: No additional findings.  ROS:  All other systems negative, except as noted in the HPI. Review of Systems  Objective: Vital Signs: Ht 5\' 9"  (1.753 m)   Wt 179 lb (81.2 kg)   BMI 26.43 kg/m   Specialty Comments:  No specialty comments available.  PMFS History: Patient Active Problem List   Diagnosis Date Noted  . Osteoarthritis of left shoulder 02/06/2019  . S/P shoulder replacement, left 02/06/2019  . DDD (degenerative disc disease), cervical 01/12/2018  . Basal cell carcinoma (BCC) of left side of neck 05/05/2017  . Chronic left shoulder pain 05/05/2017  . 10 year risk of MI or stroke 7.5% or greater 12/15/2016  . Primary osteoarthritis involving multiple joints 12/15/2016  . Affective personality disorder 05/21/2015  . Benign enlargement of prostate 05/21/2015  . Bipolar II disorder ([place]) 05/21/2015  . Cardiac dysrhythmia 05/21/2015  . Diverticulosis 05/21/2015  . Elevated PSA 05/21/2015  . History of tobacco use 05/21/2015  . Recurrent major depressive disorder ([place]) 05/21/2015   Past Medical History:  Diagnosis Date  . Depression   . History of kidney stones     Family History  Problem Relation Age of Onset  . Alzheimer's disease Mother   . Hypertension Father   . Heart disease Father     Past Surgical History:  Procedure Laterality Date  . HERNIA REPAIR    . JOINT REPLACEMENT N/A    Phreesia 08/20/2019  . TONSILLECTOMY    . TOTAL SHOULDER ARTHROPLASTY Left 02/06/2019   Procedure: TOTAL SHOULDER ARTHROPLASTY;  Surgeon: Nicholes Stairs, MD;  Location: [facility];  Service: Orthopedics;  Laterality: Left;  2.5 hrs   Social History   Occupational History  . Not on file  Tobacco Use  . Smoking status: Former Smoker    Types: Cigarettes  . Smokeless tobacco: Never Used  Vaping Use  . Vaping Use: Never  used  Substance and Sexual Activity  . Alcohol use: Yes    Alcohol/week: 5.0 standard drinks    Types: 5 Cans of beer per week  . Drug use: Never  . Sexual activity: Not on file

## 2020-01-08 DIAGNOSIS — H43812 Vitreous degeneration, left eye: Secondary | ICD-10-CM | POA: Diagnosis not present

## 2020-01-10 DIAGNOSIS — M13812 Other specified arthritis, left shoulder: Secondary | ICD-10-CM | POA: Diagnosis not present

## 2020-02-13 ENCOUNTER — Ambulatory Visit: Payer: BC Managed Care – PPO

## 2020-03-12 ENCOUNTER — Ambulatory Visit (INDEPENDENT_AMBULATORY_CARE_PROVIDER_SITE_OTHER): Payer: BC Managed Care – PPO

## 2020-03-12 ENCOUNTER — Other Ambulatory Visit: Payer: Self-pay

## 2020-03-12 DIAGNOSIS — L570 Actinic keratosis: Secondary | ICD-10-CM

## 2020-03-12 MED ORDER — AMINOLEVULINIC ACID HCL 10 % EX GEL
2000.0000 mg | Freq: Once | CUTANEOUS | Status: AC
  Administered 2020-03-12: 2000 mg via TOPICAL

## 2020-03-12 NOTE — Patient Instructions (Signed)

## 2020-03-12 NOTE — Progress Notes (Addendum)
Patient here for light therapy (PDT) on the face, tolerated well

## 2020-05-05 ENCOUNTER — Ambulatory Visit: Payer: BC Managed Care – PPO | Admitting: Emergency Medicine

## 2020-05-05 ENCOUNTER — Other Ambulatory Visit: Payer: Self-pay

## 2020-05-05 ENCOUNTER — Encounter: Payer: Self-pay | Admitting: Emergency Medicine

## 2020-05-05 VITALS — BP 146/81 | HR 51 | Temp 98.3°F | Resp 16 | Ht 69.0 in | Wt 183.0 lb

## 2020-05-05 DIAGNOSIS — Z1211 Encounter for screening for malignant neoplasm of colon: Secondary | ICD-10-CM

## 2020-05-05 DIAGNOSIS — I10 Essential (primary) hypertension: Secondary | ICD-10-CM | POA: Diagnosis not present

## 2020-05-05 DIAGNOSIS — M25831 Other specified joint disorders, right wrist: Secondary | ICD-10-CM | POA: Diagnosis not present

## 2020-05-05 DIAGNOSIS — M79672 Pain in left foot: Secondary | ICD-10-CM | POA: Diagnosis not present

## 2020-05-05 MED ORDER — LOSARTAN POTASSIUM 50 MG PO TABS: 50.0000 mg | ORAL_TABLET | Freq: Every day | ORAL | 3 refills | Status: DC

## 2020-05-05 NOTE — Patient Instructions (Addendum)
   If you have lab work done today you will be contacted with your lab results within the next 2 weeks.  If you have not heard from us then please contact us. The fastest way to get your results is to register for My Chart.   IF you received an x-ray today, you will receive an invoice from [facility]. Please contact [facility] at [phone] with questions or concerns regarding your invoice.   IF you received labwork today, you will receive an invoice from [facility]. Please contact [facility] at [phone] with questions or concerns regarding your invoice.   Our billing staff will not be able to assist you with questions regarding bills from these companies.  You will be contacted with the lab results as soon as they are available. The fastest way to get your results is to activate your My Chart account. Instructions are located on the last page of this paperwork. If you have not heard from us regarding the results in 2 weeks, please contact this office.     Hypertension, Adult High blood pressure (hypertension) is when the force of blood pumping through the arteries is too strong. The arteries are the blood vessels that carry blood from the heart throughout the body. Hypertension forces the heart to work harder to pump blood and may cause arteries to become narrow or stiff. Untreated or uncontrolled hypertension can cause a heart attack, heart failure, a stroke, kidney disease, and other problems. A blood pressure reading consists of a higher number over a lower number. Ideally, your blood pressure should be below 120/80. The first ("top") number is called the systolic pressure. It is a measure of the pressure in your arteries as your heart beats. The second ("bottom") number is called the diastolic pressure. It is a measure of the pressure in your arteries as the heart relaxes. What are the causes? The exact cause of this condition is not known. There are some conditions  that result in or are related to high blood pressure. What increases the risk? Some risk factors for high blood pressure are under your control. The following factors may make you more likely to develop this condition:  Smoking.  Having type 2 diabetes mellitus, high cholesterol, or both.  Not getting enough exercise or physical activity.  Being overweight.  Having too much fat, sugar, calories, or salt (sodium) in your diet.  Drinking too much alcohol. Some risk factors for high blood pressure may be difficult or impossible to change. Some of these factors include:  Having chronic kidney disease.  Having a family history of high blood pressure.  Age. Risk increases with age.  Race. You may be at higher risk if you are African American.  Gender. Men are at higher risk than women before age 45. After age 65, women are at higher risk than men.  Having obstructive sleep apnea.  Stress. What are the signs or symptoms? High blood pressure may not cause symptoms. Very high blood pressure (hypertensive crisis) may cause:  Headache.  Anxiety.  Shortness of breath.  Nosebleed.  Nausea and vomiting.  Vision changes.  Severe chest pain.  Seizures. How is this diagnosed? This condition is diagnosed by measuring your blood pressure while you are seated, with your arm resting on a flat surface, your legs uncrossed, and your feet flat on the floor. The cuff of the blood pressure monitor will be placed directly against the skin of your upper arm at the level of your heart.   It should be measured at least twice using the same arm. Certain conditions can cause a difference in blood pressure between your right and left arms. Certain factors can cause blood pressure readings to be lower or higher than normal for a short period of time:  When your blood pressure is higher when you are in a health care provider's office than when you are at home, this is called white coat hypertension.  Most people with this condition do not need medicines.  When your blood pressure is higher at home than when you are in a health care provider's office, this is called masked hypertension. Most people with this condition may need medicines to control blood pressure. If you have a high blood pressure reading during one visit or you have normal blood pressure with other risk factors, you may be asked to:  Return on a different day to have your blood pressure checked again.  Monitor your blood pressure at home for 1 week or longer. If you are diagnosed with hypertension, you may have other blood or imaging tests to help your health care provider understand your overall risk for other conditions. How is this treated? This condition is treated by making healthy lifestyle changes, such as eating healthy foods, exercising more, and reducing your alcohol intake. Your health care provider may prescribe medicine if lifestyle changes are not enough to get your blood pressure under control, and if:  Your systolic blood pressure is above 130.  Your diastolic blood pressure is above 80. Your personal target blood pressure may vary depending on your medical conditions, your age, and other factors. Follow these instructions at home: Eating and drinking  Eat a diet that is high in fiber and potassium, and low in sodium, added sugar, and fat. An example eating plan is called the DASH (Dietary Approaches to Stop Hypertension) diet. To eat this way: ? Eat plenty of fresh fruits and vegetables. Try to fill one half of your plate at each meal with fruits and vegetables. ? Eat whole grains, such as whole-wheat pasta, brown rice, or whole-grain bread. Fill about one fourth of your plate with whole grains. ? Eat or drink low-fat dairy products, such as skim milk or low-fat yogurt. ? Avoid fatty cuts of meat, processed or cured meats, and poultry with skin. Fill about one fourth of your plate with lean proteins, such  as fish, chicken without skin, beans, eggs, or tofu. ? Avoid pre-made and processed foods. These tend to be higher in sodium, added sugar, and fat.  Reduce your daily sodium intake. Most people with hypertension should eat less than 1,500 mg of sodium a day.  Do not drink alcohol if: ? Your health care provider tells you not to drink. ? You are pregnant, may be pregnant, or are planning to become pregnant.  If you drink alcohol: ? Limit how much you use to:  0-1 drink a day for women.  0-2 drinks a day for men. ? Be aware of how much alcohol is in your drink. In the [place] one drink equals one 12 oz bottle of beer (355 mL), one 5 oz glass of wine (148 mL), or one 1 oz glass of hard liquor (44 mL).   Lifestyle  Work with your health care provider to maintain a healthy body weight or to lose weight. Ask what an ideal weight is for you.  Get at least 30 minutes of exercise most days of the week. Activities may include walking, swimming, or  biking.  Include exercise to strengthen your muscles (resistance exercise), such as Pilates or lifting weights, as part of your weekly exercise routine. Try to do these types of exercises for 30 minutes at least 3 days a week.  Do not use any products that contain nicotine or tobacco, such as cigarettes, e-cigarettes, and chewing tobacco. If you need help quitting, ask your health care provider.  Monitor your blood pressure at home as told by your health care provider.  Keep all follow-up visits as told by your health care provider. This is important.   Medicines  Take over-the-counter and prescription medicines only as told by your health care provider. Follow directions carefully. Blood pressure medicines must be taken as prescribed.  Do not skip doses of blood pressure medicine. Doing this puts you at risk for problems and can make the medicine less effective.  Ask your health care provider about side effects or reactions to medicines that you  should watch for. Contact a health care provider if you:  Think you are having a reaction to a medicine you are taking.  Have headaches that keep coming back (recurring).  Feel dizzy.  Have swelling in your ankles.  Have trouble with your vision. Get help right away if you:  Develop a severe headache or confusion.  Have unusual weakness or numbness.  Feel faint.  Have severe pain in your chest or abdomen.  Vomit repeatedly.  Have trouble breathing. Summary  Hypertension is when the force of blood pumping through your arteries is too strong. If this condition is not controlled, it may put you at risk for serious complications.  Your personal target blood pressure may vary depending on your medical conditions, your age, and other factors. For most people, a normal blood pressure is less than 120/80.  Hypertension is treated with lifestyle changes, medicines, or a combination of both. Lifestyle changes include losing weight, eating a healthy, low-sodium diet, exercising more, and limiting alcohol. This information is not intended to replace advice given to you by your health care provider. Make sure you discuss any questions you have with your health care provider. Document Revised: 11/09/2017 Document Reviewed: 11/09/2017 Elsevier Patient Education  2021 [employer].

## 2020-05-05 NOTE — Progress Notes (Signed)
Daniel Lucero 70 y.o.   Chief Complaint  Patient presents with  . Hypertension    Follow up  . Foot Pain    Per patient left foot with numbness for several months  . Leg Swelling    Per patient right knee and stiffness for several months    HISTORY OF PRESENT ILLNESS: This is a 70 y.o. male concerned about high blood pressure readings at home. 140-150/100s. Also complaining of small tender mass to right wrist and pain to left foot and right knee. Patient has history of osteoarthritis of multiple sites. Denies any recent injuries. No other complaints or medical concerns today.  HPI   Prior to Admission medications   Medication Sig Start Date End Date Taking? Authorizing Provider  Acetaminophen (TYLENOL PO) Take by mouth.   Yes [provider]  aspirin 325 MG EC tablet Take 325 mg by mouth daily as needed for pain (headache).    Yes [provider]  tamsulosin (FLOMAX) 0.4 MG CAPS capsule Take 0.4 mg by mouth at bedtime.   Yes [provider]  mupirocin ointment (BACTROBAN) 2 % Apply 1 application topically 2 (two) times daily. Patient not taking: Reported on 05/05/2020 12/31/19   Maximiano Coss, NP    Allergies  Allergen Reactions  . Penicillins Rash and Other (See Comments)    Has patient had a PCN reaction causing immediate rash, facial/tongue/throat swelling, SOB or lightheadedness with hypotension: Yes Has patient had a PCN reaction causing severe rash involving mucus membranes or skin necrosis: No Has patient had a PCN reaction that required hospitalization: No Has patient had a PCN reaction occurring within the last 10 years: No If all of the above answers are "NO", then may proceed with Cephalosporin use.    Patient Active Problem List   Diagnosis Date Noted  . Osteoarthritis of left shoulder 02/06/2019  . S/P shoulder replacement, left 02/06/2019  . DDD (degenerative disc disease), cervical 01/12/2018  . Basal cell carcinoma  (BCC) of left side of neck 05/05/2017  . Chronic left shoulder pain 05/05/2017  . 10 year risk of MI or stroke 7.5% or greater 12/15/2016  . Primary osteoarthritis involving multiple joints 12/15/2016  . Affective personality disorder 05/21/2015  . Benign enlargement of prostate 05/21/2015  . Bipolar II disorder ([place]) 05/21/2015  . Cardiac dysrhythmia 05/21/2015  . Diverticulosis 05/21/2015  . Elevated PSA 05/21/2015  . History of tobacco use 05/21/2015  . Recurrent major depressive disorder ([place]) 05/21/2015    Past Medical History:  Diagnosis Date  . Depression   . History of kidney stones     Past Surgical History:  Procedure Laterality Date  . HERNIA REPAIR    . JOINT REPLACEMENT N/A    Phreesia 08/20/2019  . TONSILLECTOMY    . TOTAL SHOULDER ARTHROPLASTY Left 02/06/2019   Procedure: TOTAL SHOULDER ARTHROPLASTY;  Surgeon: Nicholes Stairs, MD;  Location: [facility];  Service: Orthopedics;  Laterality: Left;  2.5 hrs    Social History   Socioeconomic History  . Marital status: Married    Spouse name: Not on file  . Number of children: Not on file  . Years of education: Not on file  . Highest education level: Not on file  Occupational History  . Not on file  Tobacco Use  . Smoking status: Former Smoker    Types: Cigarettes  . Smokeless tobacco: Never Used  Vaping Use  . Vaping Use: Never used  Substance and Sexual Activity  . Alcohol use:  Yes    Alcohol/week: 5.0 standard drinks    Types: 5 Cans of beer per week  . Drug use: Never  . Sexual activity: Not on file  Other Topics Concern  . Not on file  Social History Narrative  . Not on file   Social Determinants of Health   Financial Resource Strain: Not on file  Food Insecurity: Not on file  Transportation Needs: Not on file  Physical Activity: Not on file  Stress: Not on file  Social Connections: Not on file  Intimate Partner Violence: Not on file    Family History  Problem Relation Age of Onset   . Alzheimer's disease Mother   . Hypertension Father   . Heart disease Father      Review of Systems  Constitutional: Negative.  Negative for chills and fever.  HENT: Negative.  Negative for sinus pain and sore throat.   Respiratory: Negative.  Negative for cough and shortness of breath.   Cardiovascular: Negative.  Negative for chest pain.  Gastrointestinal: Negative.  Negative for abdominal pain, diarrhea, nausea and vomiting.  Genitourinary: Negative.  Negative for dysuria and hematuria.  Musculoskeletal: Positive for joint pain.  Skin: Negative.  Negative for rash.  Neurological: Negative.  Negative for dizziness and headaches.  All other systems reviewed and are negative.  Today's Vitals   05/05/20 1513  BP: (!) 146/81  Pulse: (!) 51  Resp: 16  Temp: 98.3 F (36.8 C)  TempSrc: Temporal  SpO2: 98%  Weight: 183 lb (83 kg)  Height: 5\' 9"  (1.753 m)   Body mass index is 27.02 kg/m.   Physical Exam Vitals reviewed.  Constitutional:      Appearance: Normal appearance.  HENT:     Head: Normocephalic.  Eyes:     Extraocular Movements: Extraocular movements intact.     Pupils: Pupils are equal, round, and reactive to light.  Cardiovascular:     Rate and Rhythm: Normal rate and regular rhythm.     Pulses: Normal pulses.     Heart sounds: Normal heart sounds.  Pulmonary:     Effort: Pulmonary effort is normal.     Breath sounds: Normal breath sounds.  Musculoskeletal:        General: Normal range of motion.     Cervical back: Normal range of motion and neck supple.     Comments: Left foot: Warm to touch. No erythema or ecchymosis. Excellent distal pulses and capillary refill. Some tenderness to plantar surface of distal foot at MTP joints. Full range of motion of toes. Right foot: Within normal limits. Right knee: Mild swelling. Full range of motion. No tenderness. Rest of lower extremities within normal limits. Right wrist: Nontender lump on radial side volar  aspect with limited range of motion Right hand: Within normal limits and neurovascularly intact  Skin:    General: Skin is warm and dry.     Capillary Refill: Capillary refill takes less than 2 seconds.  Neurological:     General: No focal deficit present.     Mental Status: He is alert and oriented to person, place, and time.  Psychiatric:        Mood and Affect: Mood normal.        Behavior: Behavior normal.      ASSESSMENT & PLAN: Daniel Lucero was seen today for hypertension, foot pain and leg swelling.  Diagnoses and all orders for this visit:  Essential hypertension -     losartan (COZAAR) 50 MG tablet; Take 1  tablet (50 mg total) by mouth daily.  Left foot pain -     Ambulatory referral to Podiatry  Mass of joint of right wrist -     Ambulatory referral to Hand Surgery  Screening for colon cancer -     Ambulatory referral to Gastroenterology    Patient Instructions       If you have lab work done today you will be contacted with your lab results within the next 2 weeks.  If you have not heard from [place] then please contact us. The fastest way to get your results is to register for My Chart.   IF you received an x-ray today, you will receive an invoice from [facility]. Please contact [facility] at [phone] with questions or concerns regarding your invoice.   IF you received labwork today, you will receive an invoice from [facility]. Please contact [facility] at [phone] with questions or concerns regarding your invoice.   Our billing staff will not be able to assist you with questions regarding bills from these companies.  You will be contacted with the lab results as soon as they are available. The fastest way to get your results is to activate your My Chart account. Instructions are located on the last page of this paperwork. If you have not heard from [place] regarding the results in 2 weeks, please contact this office.     Hypertension,  Adult High blood pressure (hypertension) is when the force of blood pumping through the arteries is too strong. The arteries are the blood vessels that carry blood from the heart throughout the body. Hypertension forces the heart to work harder to pump blood and may cause arteries to become narrow or stiff. Untreated or uncontrolled hypertension can cause a heart attack, heart failure, a stroke, kidney disease, and other problems. A blood pressure reading consists of a higher number over a lower number. Ideally, your blood pressure should be below 120/80. The first ("top") number is called the systolic pressure. It is a measure of the pressure in your arteries as your heart beats. The second ("bottom") number is called the diastolic pressure. It is a measure of the pressure in your arteries as the heart relaxes. What are the causes? The exact cause of this condition is not known. There are some conditions that result in or are related to high blood pressure. What increases the risk? Some risk factors for high blood pressure are under your control. The following factors may make you more likely to develop this condition:  Smoking.  Having type 2 diabetes mellitus, high cholesterol, or both.  Not getting enough exercise or physical activity.  Being overweight.  Having too much fat, sugar, calories, or salt (sodium) in your diet.  Drinking too much alcohol. Some risk factors for high blood pressure may be difficult or impossible to change. Some of these factors include:  Having chronic kidney disease.  Having a family history of high blood pressure.  Age. Risk increases with age.  Race. You may be at higher risk if you are African American.  Gender. Men are at higher risk than women before age 59. After age 79, women are at higher risk than men.  Having obstructive sleep apnea.  Stress. What are the signs or symptoms? High blood pressure may not cause symptoms. Very high blood  pressure (hypertensive crisis) may cause:  Headache.  Anxiety.  Shortness of breath.  Nosebleed.  Nausea and vomiting.  Vision changes.  Severe chest pain.  Seizures. How is this diagnosed? This condition is diagnosed by measuring your blood pressure while you are seated, with your arm resting on a flat surface, your legs uncrossed, and your feet flat on the floor. The cuff of the blood pressure monitor will be placed directly against the skin of your upper arm at the level of your heart. It should be measured at least twice using the same arm. Certain conditions can cause a difference in blood pressure between your right and left arms. Certain factors can cause blood pressure readings to be lower or higher than normal for a short period of time:  When your blood pressure is higher when you are in a health care provider's office than when you are at home, this is called white coat hypertension. Most people with this condition do not need medicines.  When your blood pressure is higher at home than when you are in a health care provider's office, this is called masked hypertension. Most people with this condition may need medicines to control blood pressure. If you have a high blood pressure reading during one visit or you have normal blood pressure with other risk factors, you may be asked to:  Return on a different day to have your blood pressure checked again.  Monitor your blood pressure at home for 1 week or longer. If you are diagnosed with hypertension, you may have other blood or imaging tests to help your health care provider understand your overall risk for other conditions. How is this treated? This condition is treated by making healthy lifestyle changes, such as eating healthy foods, exercising more, and reducing your alcohol intake. Your health care provider may prescribe medicine if lifestyle changes are not enough to get your blood pressure under control, and if:  Your  systolic blood pressure is above 130.  Your diastolic blood pressure is above 80. Your personal target blood pressure may vary depending on your medical conditions, your age, and other factors. Follow these instructions at home: Eating and drinking  Eat a diet that is high in fiber and potassium, and low in sodium, added sugar, and fat. An example eating plan is called the DASH (Dietary Approaches to Stop Hypertension) diet. To eat this way: ? Eat plenty of fresh fruits and vegetables. Try to fill one half of your plate at each meal with fruits and vegetables. ? Eat whole grains, such as whole-wheat pasta, brown rice, or whole-grain bread. Fill about one fourth of your plate with whole grains. ? Eat or drink low-fat dairy products, such as skim milk or low-fat yogurt. ? Avoid fatty cuts of meat, processed or cured meats, and poultry with skin. Fill about one fourth of your plate with lean proteins, such as fish, chicken without skin, beans, eggs, or tofu. ? Avoid pre-made and processed foods. These tend to be higher in sodium, added sugar, and fat.  Reduce your daily sodium intake. Most people with hypertension should eat less than 1,500 mg of sodium a day.  Do not drink alcohol if: ? Your health care provider tells you not to drink. ? You are pregnant, may be pregnant, or are planning to become pregnant.  If you drink alcohol: ? Limit how much you use to:  0-1 drink a day for women.  0-2 drinks a day for men. ? Be aware of how much alcohol is in your drink. In the [place] one drink equals one 12 oz bottle of beer (355 mL), one 5 oz glass of wine (  148 mL), or one 1 oz glass of hard liquor (44 mL).   Lifestyle  Work with your health care provider to maintain a healthy body weight or to lose weight. Ask what an ideal weight is for you.  Get at least 30 minutes of exercise most days of the week. Activities may include walking, swimming, or biking.  Include exercise to strengthen your  muscles (resistance exercise), such as Pilates or lifting weights, as part of your weekly exercise routine. Try to do these types of exercises for 30 minutes at least 3 days a week.  Do not use any products that contain nicotine or tobacco, such as cigarettes, e-cigarettes, and chewing tobacco. If you need help quitting, ask your health care provider.  Monitor your blood pressure at home as told by your health care provider.  Keep all follow-up visits as told by your health care provider. This is important.   Medicines  Take over-the-counter and prescription medicines only as told by your health care provider. Follow directions carefully. Blood pressure medicines must be taken as prescribed.  Do not skip doses of blood pressure medicine. Doing this puts you at risk for problems and can make the medicine less effective.  Ask your health care provider about side effects or reactions to medicines that you should watch for. Contact a health care provider if you:  Think you are having a reaction to a medicine you are taking.  Have headaches that keep coming back (recurring).  Feel dizzy.  Have swelling in your ankles.  Have trouble with your vision. Get help right away if you:  Develop a severe headache or confusion.  Have unusual weakness or numbness.  Feel faint.  Have severe pain in your chest or abdomen.  Vomit repeatedly.  Have trouble breathing. Summary  Hypertension is when the force of blood pumping through your arteries is too strong. If this condition is not controlled, it may put you at risk for serious complications.  Your personal target blood pressure may vary depending on your medical conditions, your age, and other factors. For most people, a normal blood pressure is less than 120/80.  Hypertension is treated with lifestyle changes, medicines, or a combination of both. Lifestyle changes include losing weight, eating a healthy, low-sodium diet, exercising more,  and limiting alcohol. This information is not intended to replace advice given to you by your health care provider. Make sure you discuss any questions you have with your health care provider. Document Revised: 11/09/2017 Document Reviewed: 11/09/2017 Elsevier Patient Education  2021 Elsevier Inc.      Agustina Caroli, MD [facility]

## 2020-05-13 DIAGNOSIS — C4492 Squamous cell carcinoma of skin, unspecified: Secondary | ICD-10-CM

## 2020-05-13 HISTORY — DX: Squamous cell carcinoma of skin, unspecified: C44.92

## 2020-05-15 ENCOUNTER — Ambulatory Visit: Payer: BC Managed Care – PPO

## 2020-05-15 ENCOUNTER — Ambulatory Visit: Payer: BC Managed Care – PPO | Admitting: Orthopedic Surgery

## 2020-05-15 DIAGNOSIS — M25531 Pain in right wrist: Secondary | ICD-10-CM | POA: Diagnosis not present

## 2020-05-16 ENCOUNTER — Encounter: Payer: Self-pay | Admitting: Orthopedic Surgery

## 2020-05-16 ENCOUNTER — Ambulatory Visit (INDEPENDENT_AMBULATORY_CARE_PROVIDER_SITE_OTHER): Payer: BC Managed Care – PPO

## 2020-05-16 ENCOUNTER — Ambulatory Visit: Payer: BC Managed Care – PPO | Admitting: Podiatry

## 2020-05-16 ENCOUNTER — Encounter: Payer: Self-pay | Admitting: Podiatry

## 2020-05-16 ENCOUNTER — Other Ambulatory Visit: Payer: Self-pay

## 2020-05-16 DIAGNOSIS — M779 Enthesopathy, unspecified: Secondary | ICD-10-CM | POA: Diagnosis not present

## 2020-05-16 DIAGNOSIS — D3610 Benign neoplasm of peripheral nerves and autonomic nervous system, unspecified: Secondary | ICD-10-CM

## 2020-05-16 DIAGNOSIS — M79672 Pain in left foot: Secondary | ICD-10-CM | POA: Diagnosis not present

## 2020-05-16 DIAGNOSIS — M79671 Pain in right foot: Secondary | ICD-10-CM

## 2020-05-16 MED ORDER — TRIAMCINOLONE ACETONIDE 10 MG/ML IJ SUSP
10.0000 mg | Freq: Once | INTRAMUSCULAR | Status: AC
  Administered 2020-05-16: 10 mg

## 2020-05-16 NOTE — Progress Notes (Signed)
Office Visit Note   Patient: Daniel Lucero [facility]           Date of Birth: 1950-07-17           MRN: [id number] Visit Date: 05/15/2020              Requested by: Horald Pollen, MD [place],  [place] [address] PCP: Horald Pollen, MD  Chief Complaint  Patient presents with   Right Wrist - Pain    Right hand dom       HPI: Patient is a 70 year old gentleman who presents complaining of swelling and pain right wrist.  Patient states he has difficulty with grip strength twisting and turning.  Assessment & Plan: Visit Diagnoses:  1. Pain in right wrist     Plan: We will refer patient to Dr. Amedeo Plenty for evaluation for treatment of the right wrist pathology.  Follow-Up Instructions: Return if symptoms worsen or fail to improve.   Ortho Exam  Patient is alert, oriented, no adenopathy, well-dressed, normal affect, normal respiratory effort. Examination patient has palpable pulses he has weakness with grip strength.  He has significant swelling over the radial aspect of the wrist both volarly and dorsally.  He has tenderness to palpation of the scaphoid scapholunate intervals.  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  Imaging: No results found. No images are attached to the encounter.  Labs: No results found for: HGBA1C, ESRSEDRATE, CRP, LABURIC, REPTSTATUS, GRAMSTAIN, CULT, LABORGA   Lab Results  Component Value Date   ALBUMIN 4.1 09/03/2019    No results found for: MG No results found for: VD25OH  No results found for: PREALBUMIN CBC EXTENDED Latest Ref Rng & Units 02/02/2019  WBC 4.0 - 10.5 K/uL 5.5  RBC 4.22 - 5.81 MIL/uL 4.75  HGB 13.0 - 17.0 g/dL 15.1  HCT 39.0 - 52.0 % 43.3  PLT 150 - 400 K/uL 211     There is no height or weight on file to calculate BMI.  Orders:  Orders Placed This Encounter  Procedures   XR Wrist 2 Views  Right   Ambulatory referral to Orthopedic Surgery   No orders of the defined types were placed in this encounter.    Procedures: No procedures performed  Clinical Data: No additional findings.  ROS:  All other systems negative, except as noted in the HPI. Review of Systems  Objective: Vital Signs: There were no vitals taken for this visit.  Specialty Comments:  No specialty comments available.  PMFS History: Patient Active Problem List   Diagnosis Date Noted   Osteoarthritis of left shoulder 02/06/2019   S/P shoulder replacement, left 02/06/2019   DDD (degenerative disc disease), cervical 01/12/2018   Basal cell carcinoma (BCC) of left side of neck 05/05/2017   Chronic left shoulder pain 05/05/2017   10 year risk of MI or stroke 7.5% or greater 12/15/2016   Primary osteoarthritis involving multiple joints 12/15/2016   Affective personality disorder 05/21/2015   Benign enlargement of prostate 05/21/2015   Bipolar II disorder ([place]) 05/21/2015   Cardiac dysrhythmia 05/21/2015   Diverticulosis 05/21/2015   Elevated PSA 05/21/2015   History of tobacco use 05/21/2015   Recurrent major depressive disorder ([place]) 05/21/2015   Past Medical History:  Diagnosis Date   Depression    History of kidney stones     Family History  Problem Relation Age of Onset  Alzheimer's disease Mother    Hypertension Father    Heart disease Father     Past Surgical History:  Procedure Laterality Date   HERNIA REPAIR     JOINT REPLACEMENT N/A    Phreesia 08/20/2019   TONSILLECTOMY     TOTAL SHOULDER ARTHROPLASTY Left 02/06/2019   Procedure: TOTAL SHOULDER ARTHROPLASTY;  Surgeon: Nicholes Stairs, MD;  Location: [facility];  Service: Orthopedics;  Laterality: Left;  2.5 hrs   Social History   Occupational History   Not on file  Tobacco Use   Smoking status: Former Smoker    Types: Cigarettes   Smokeless tobacco: Never Used  Scientific laboratory technician Use:  Never used  Substance and Sexual Activity   Alcohol use: Yes    Alcohol/week: 5.0 standard drinks    Types: 5 Cans of beer per week   Drug use: Never   Sexual activity: Not on file

## 2020-05-16 NOTE — Progress Notes (Signed)
Subjective:   Patient ID: Daniel Lucero, male   DOB: 70 y.o.   MRN: [id number]   HPI Patient states he has had a lot of pain in his left foot for at least a year.  States that he gets numbing and tingling at times and that he gets pain when he first steps down in the morning and with certain shoes.  At times he gets numbness with prolonged walking.  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       Objective:  Physical Exam Vitals and nursing note reviewed.  Constitutional:      Appearance: He is well-developed and well-nourished.  Cardiovascular:     Pulses: Intact distal pulses.  Pulmonary:     Effort: Pulmonary effort is normal.  Musculoskeletal:        General: Normal range of motion.  Skin:    General: Skin is warm.  Neurological:     Mental Status: He is alert.     Neurovascular status intact muscle strength found to be adequate range of motion adequate.  Patient is found to have an inflamed third MPJ and also has a palpable mass third interspace left foot that also appears to be symptomatic both areas appear to be giving her trouble with patient found to have good digital perfusion well oriented x3     Assessment:  Acute capsulitis third MPJ possible versus possible neuroma third interspace left versus possible combination of the 2 conditions     Plan:  H&P reviewed conditions.  At this point I am at a focus first on the joint I did sterile prep of the area I aspirated the joint getting a small amount of clear fluid injected quarter cc dexamethasone Kenalog and applied thick padding to reduce pressure on the joint surface.  Wear rigid bottom shoes will reevaluate in 2 weeks and try to decipher between the 2 different conditions which I explained both to him today  X-rays indicate that there is a slight elongation third metatarsal left no  indication stress fracture

## 2020-05-22 ENCOUNTER — Encounter: Payer: Self-pay | Admitting: Orthopedic Surgery

## 2020-05-26 ENCOUNTER — Ambulatory Visit: Payer: BC Managed Care – PPO | Admitting: Dermatology

## 2020-05-29 ENCOUNTER — Encounter: Payer: Self-pay | Admitting: Podiatry

## 2020-05-29 ENCOUNTER — Ambulatory Visit (INDEPENDENT_AMBULATORY_CARE_PROVIDER_SITE_OTHER): Payer: BC Managed Care – PPO | Admitting: Podiatry

## 2020-05-29 ENCOUNTER — Other Ambulatory Visit: Payer: Self-pay

## 2020-05-29 DIAGNOSIS — D3610 Benign neoplasm of peripheral nerves and autonomic nervous system, unspecified: Secondary | ICD-10-CM

## 2020-05-29 NOTE — Patient Instructions (Signed)

## 2020-06-01 NOTE — Progress Notes (Signed)
Subjective:   Patient ID: Daniel Lucero, male   DOB: 70 y.o.   MRN: [id number]   HPI Patient states he is not sure if pain is improving states that he had very temporary relief and then reoccurrence   ROS      Objective:  Physical Exam  Neurovascular status is found to be intact with discomfort which is not now centered as much in the metatarsal phalangeal joint as it is in the interspace with what appears to be a positive Biagio Borg sign     Assessment:  Difficult to ascertain between a neuroma symptomatology versus an inflammatory capsular     Plan:  H&P reviewed condition and at this point we will get a go ahead and worsen neuroma and I did sterile prep and injected the interspace with a purified alcohol Marcaine solution with a small amount of steroid and I want to see the results of this over several weeks.  May ultimately require excision but will get a try continued conservative treatment

## 2020-06-05 ENCOUNTER — Other Ambulatory Visit: Payer: Self-pay

## 2020-06-05 ENCOUNTER — Ambulatory Visit: Payer: BC Managed Care – PPO | Admitting: Dermatology

## 2020-06-05 ENCOUNTER — Encounter: Payer: Self-pay | Admitting: Dermatology

## 2020-06-05 DIAGNOSIS — D485 Neoplasm of uncertain behavior of skin: Secondary | ICD-10-CM

## 2020-06-05 DIAGNOSIS — C44329 Squamous cell carcinoma of skin of other parts of face: Secondary | ICD-10-CM

## 2020-06-05 DIAGNOSIS — L570 Actinic keratosis: Secondary | ICD-10-CM

## 2020-06-05 NOTE — Patient Instructions (Signed)

## 2020-06-12 ENCOUNTER — Other Ambulatory Visit: Payer: Self-pay

## 2020-06-12 ENCOUNTER — Ambulatory Visit (INDEPENDENT_AMBULATORY_CARE_PROVIDER_SITE_OTHER): Payer: BC Managed Care – PPO | Admitting: Podiatry

## 2020-06-12 DIAGNOSIS — D3610 Benign neoplasm of peripheral nerves and autonomic nervous system, unspecified: Secondary | ICD-10-CM

## 2020-06-12 DIAGNOSIS — G5762 Lesion of plantar nerve, left lower limb: Secondary | ICD-10-CM | POA: Diagnosis not present

## 2020-06-12 NOTE — Progress Notes (Signed)
Subjective:   Patient ID: Daniel Lucero, male   DOB: 70 y.o.   MRN: [id number]   HPI Patient states he has had some improvement with the treatment that we did at last visit and states he feels like that is most likely the problem   ROS      Objective:  Physical Exam  Neurovascular status intact with patient's third interspace left showing radiating discomfort and moderate shooting pain     Assessment:  Neuroma symptomatology left improving with still possibility for capsulitis     Plan:  H&P reviewed the different treatment options and at this point we will continue with injection treatment.  Sterile prep done injected the 3rd nerve root with a combination of a purified alcohol Marcaine and small amount of steroid agent to reduce inflammation and reappoint to recheck

## 2020-06-16 ENCOUNTER — Encounter: Payer: Self-pay | Admitting: Dermatology

## 2020-06-16 NOTE — Progress Notes (Signed)
   Follow-Up Visit   Subjective  Daniel Lucero is a 70 y.o. male who presents for the following: Follow-up (Minor burn peeled just a little).  Actinic keratoses post PDT, 1 spot upper forehead did not respond. Location:  Duration:  Quality:  Associated Signs/Symptoms: Modifying Factors:  Severity:  Timing: Context:   Objective  Well appearing patient in no apparent distress; mood and affect are within normal limits. Objective  Head - Anterior (Face): Moderate peeling after PDT.  Overall there is perhaps 50 to 60% improvement.  1 lesion upper forehead has actually grown and I suspect it is a superficial squamous cell carcinoma.  Objective  Mid Frontal Scalp: 1 cm waxy crust that fits a superficial SCCA.  After shave biopsy the base was treated with curettage plus cautery.         A focused examination was performed including Head and neck.. Relevant physical exam findings are noted in the Assessment and Plan.   Assessment & Plan    AK (actinic keratosis) Head - Anterior (Face)  Recheck 1 year.  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  Skin / nail biopsy Type of biopsy: tangential   Informed consent: discussed and consent obtained   Timeout: patient name, date of birth, surgical site, and procedure verified   Procedure prep:  Patient was prepped and draped in usual sterile fashion (Non sterile) Prep type:  Chlorhexidine Anesthesia: the lesion was anesthetized in a standard fashion   Anesthetic:  1% lidocaine w/ epinephrine 1-100,000 local infiltration Instrument used: flexible razor blade   Outcome: patient tolerated procedure well   Post-procedure details: wound care instructions given    Destruction of lesion Complexity: simple   Destruction method: electrodesiccation and curettage   Informed consent: discussed and consent obtained   Timeout:  patient name, date of birth, surgical site, and procedure  verified Anesthesia: the lesion was anesthetized in a standard fashion   Anesthetic:  1% lidocaine w/ epinephrine 1-100,000 local infiltration Curettage performed in three different directions: Yes     Electrodesiccation performed over the curetted area: No   Curettage cycles:  3 Lesion length (cm):  1.3 Lesion width (cm):  1.3 Margin per side (cm):  0 Final wound size (cm):  1.3 Hemostasis achieved with:  ferric subsulfate Outcome: patient tolerated procedure well with no complications   Post-procedure details: sterile dressing applied and wound care instructions given   Dressing type: bandage and petrolatum   Additional details:  Wound innoculated with 5 fluorouracil solution.  Specimen 1 - Surgical pathology Differential Diagnosis: R/O BCC vs SCC Treated after biopsy  Check Margins: No      I, Lavonna Monarch, MD, have reviewed all documentation for this visit.  The documentation on 06/16/20 for the exam, diagnosis, procedures, and orders are all accurate and complete.

## 2020-06-23 ENCOUNTER — Telehealth: Payer: Self-pay | Admitting: Podiatry

## 2020-06-23 NOTE — Telephone Encounter (Signed)
Called pt lvm to reschedule 4/25 appt to 4/27

## 2020-07-07 ENCOUNTER — Ambulatory Visit: Payer: BC Managed Care – PPO | Admitting: Podiatry

## 2020-07-11 ENCOUNTER — Encounter: Payer: Self-pay | Admitting: Podiatry

## 2020-07-11 ENCOUNTER — Ambulatory Visit (INDEPENDENT_AMBULATORY_CARE_PROVIDER_SITE_OTHER): Payer: BC Managed Care – PPO | Admitting: Podiatry

## 2020-07-11 ENCOUNTER — Other Ambulatory Visit: Payer: Self-pay

## 2020-07-11 DIAGNOSIS — G5762 Lesion of plantar nerve, left lower limb: Secondary | ICD-10-CM | POA: Diagnosis not present

## 2020-07-11 DIAGNOSIS — M779 Enthesopathy, unspecified: Secondary | ICD-10-CM

## 2020-07-11 NOTE — Progress Notes (Signed)
Subjective:   Patient ID: Ronal Fear, male   DOB: 70 y.o.   MRN: [id number]   HPI Patient presents stating on mildly improved but still sore especially when I wear certain shoes and walk for distances.  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  I did sterile prep and injected the third interspace with a purified alcohol Marcaine small amount of steroid solution 1.5 cc to try to continue to sclerosis and reduce inflammation of the nerve and patient will be seen back in approximate 4 weeks

## 2020-08-25 ENCOUNTER — Other Ambulatory Visit: Payer: Self-pay

## 2020-08-25 ENCOUNTER — Encounter: Payer: Self-pay | Admitting: Podiatry

## 2020-08-25 ENCOUNTER — Ambulatory Visit: Payer: BC Managed Care – PPO | Admitting: Podiatry

## 2020-08-25 DIAGNOSIS — G5762 Lesion of plantar nerve, left lower limb: Secondary | ICD-10-CM

## 2020-08-25 NOTE — Progress Notes (Signed)
Subjective:   Patient ID: Daniel Lucero, male   DOB: 70 y.o.   MRN: [id number]   HPI Patient presents stating I think I am improving but I still have pain if I do too much activity.  States he gets some numbing pains and some occasional shooting pains but much better than previous   ROS      Objective:  Physical Exam  Neurovascular status intact with continued discomfort third interspace left with improvement but pain still present upon deep palpation     Assessment:  Probability for neuroma symptomatology still left cannot rule out inflammatory complex or combination of the 2     Plan:  H&P reviewed condition sterile prep and injected the nerve root with a purified alcohol Marcaine and dexamethasone combination and advised on wider shoes and reappoint as needed.  Patient is to be seen back if any symptoms were to recur

## 2020-10-31 ENCOUNTER — Other Ambulatory Visit: Payer: Self-pay | Admitting: Urology

## 2020-10-31 DIAGNOSIS — R972 Elevated prostate specific antigen [PSA]: Secondary | ICD-10-CM

## 2020-11-03 ENCOUNTER — Other Ambulatory Visit: Payer: Self-pay

## 2020-11-03 ENCOUNTER — Encounter: Payer: Self-pay | Admitting: Emergency Medicine

## 2020-11-03 ENCOUNTER — Ambulatory Visit: Payer: BC Managed Care – PPO | Admitting: Emergency Medicine

## 2020-11-03 VITALS — BP 118/80 | HR 57 | Temp 98.1°F | Ht 69.0 in | Wt 185.0 lb

## 2020-11-03 DIAGNOSIS — K5790 Diverticulosis of intestine, part unspecified, without perforation or abscess without bleeding: Secondary | ICD-10-CM | POA: Diagnosis not present

## 2020-11-03 DIAGNOSIS — M8949 Other hypertrophic osteoarthropathy, multiple sites: Secondary | ICD-10-CM

## 2020-11-03 DIAGNOSIS — I10 Essential (primary) hypertension: Secondary | ICD-10-CM | POA: Insufficient documentation

## 2020-11-03 DIAGNOSIS — M159 Polyosteoarthritis, unspecified: Secondary | ICD-10-CM

## 2020-11-03 LAB — CBC WITH DIFFERENTIAL/PLATELET
Basophils Absolute: 0.1 10*3/uL (ref 0.0–0.1)
Basophils Relative: 0.9 % (ref 0.0–3.0)
Eosinophils Absolute: 0.3 10*3/uL (ref 0.0–0.7)
Eosinophils Relative: 6 % — ABNORMAL HIGH (ref 0.0–5.0)
HCT: 41.5 % (ref 39.0–52.0)
Hemoglobin: 14.4 g/dL (ref 13.0–17.0)
Lymphocytes Relative: 20.9 % (ref 12.0–46.0)
Lymphs Abs: 1.2 10*3/uL (ref 0.7–4.0)
MCHC: 34.7 g/dL (ref 30.0–36.0)
MCV: 91.3 fl (ref 78.0–100.0)
Monocytes Absolute: 0.6 10*3/uL (ref 0.1–1.0)
Monocytes Relative: 11.3 % (ref 3.0–12.0)
Neutro Abs: 3.5 10*3/uL (ref 1.4–7.7)
Neutrophils Relative %: 60.9 % (ref 43.0–77.0)
Platelets: 199 10*3/uL (ref 150.0–400.0)
RBC: 4.54 Mil/uL (ref 4.22–5.81)
RDW: 12.9 % (ref 11.5–15.5)
WBC: 5.7 10*3/uL (ref 4.0–10.5)

## 2020-11-03 LAB — COMPREHENSIVE METABOLIC PANEL
ALT: 25 U/L (ref 0–53)
AST: 26 U/L (ref 0–37)
Albumin: 4.2 g/dL (ref 3.5–5.2)
Alkaline Phosphatase: 64 U/L (ref 39–117)
BUN: 20 mg/dL (ref 6–23)
CO2: 28 mEq/L (ref 19–32)
Calcium: 9.3 mg/dL (ref 8.4–10.5)
Chloride: 106 mEq/L (ref 96–112)
Creatinine, Ser: 1.01 mg/dL (ref 0.40–1.50)
GFR: 76.1 mL/min (ref >60.00)
Glucose, Bld: 85 mg/dL (ref 70–99)
Potassium: 4.2 mEq/L (ref 3.5–5.1)
Sodium: 140 mEq/L (ref 135–145)
Total Bilirubin: 0.6 mg/dL (ref 0.2–1.2)
Total Protein: 7 g/dL (ref 6.0–8.3)

## 2020-11-03 LAB — LIPID PANEL
Cholesterol: 136 mg/dL (ref 0–200)
HDL: 51.1 mg/dL (ref >39.00)
LDL Cholesterol: 72 mg/dL (ref 0–99)
NonHDL: 84.9
Total CHOL/HDL Ratio: 3
Triglycerides: 65 mg/dL (ref 0.0–149.0)
VLDL: 13 mg/dL (ref 0.0–40.0)

## 2020-11-03 NOTE — Assessment & Plan Note (Signed)
History of chronically elevated PSA.  Scheduled for prostate MRI.

## 2020-11-03 NOTE — Assessment & Plan Note (Signed)
Well-controlled hypertension continue losartan 50 mg daily. Dietary approaches to stop hypertension discussed.

## 2020-11-03 NOTE — Assessment & Plan Note (Signed)
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  Recommended surgery by orthopedist.

## 2020-11-03 NOTE — Progress Notes (Signed)
Daniel Lucero 70 y.o.   Chief Complaint  Patient presents with   Hypertension    HISTORY OF PRESENT ILLNESS: This is a 70 y.o. male with history of hypertension here for follow-up.  Presently taking losartan 50 mg daily. Also has history of BPH with lower urinary tract symptoms, presently on tamsulosin 0.4 mg daily.  Scheduled for prostate MRI. 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  Recommended surgery by orthopedist. History of bipolar disorder.  Doing well and stable.   Hypertension Pertinent negatives include no chest pain, headaches, palpitations or shortness of breath.    Prior to Admission medications   Medication Sig Start Date End Date Taking? Authorizing Provider  losartan (COZAAR) 50 MG tablet Take 1 tablet (50 mg total) by mouth daily. 05/05/20  Yes Wessley Emert, Ines Bloomer, MD  tamsulosin (FLOMAX) 0.4 MG CAPS capsule Take 0.4 mg by mouth at bedtime.   Yes [provider]  tamsulosin (FLOMAX) 0.4 MG CAPS capsule Take 1 capsule by mouth daily. Patient not taking: Reported on 11/03/2020 05/21/15   [provider]    Allergies  Allergen Reactions   Penicillins Rash and Other (See Comments)    Has patient had a PCN reaction causing immediate rash, facial/tongue/throat swelling, SOB or lightheadedness with hypotension: Yes Has patient had a PCN reaction causing severe rash involving mucus membranes or skin necrosis: No Has patient had a PCN reaction that required hospitalization: No Has patient had a PCN reaction occurring within the last 10 years: No If all of the above answers are "NO", then may proceed with Cephalosporin use.    Patient Active Problem List   Diagnosis Date Noted   Osteoarthritis of left shoulder 02/06/2019   S/P shoulder replacement, left 02/06/2019   DDD (degenerative disc disease), cervical 01/12/2018   Basal cell carcinoma (BCC) of left side of neck 05/05/2017   Chronic left shoulder pain 05/05/2017   10 year risk of  MI or stroke 7.5% or greater 12/15/2016   Primary osteoarthritis involving multiple joints 12/15/2016   Affective personality disorder 05/21/2015   Benign enlargement of prostate 05/21/2015   Bipolar II disorder ([place]) 05/21/2015   Cardiac dysrhythmia 05/21/2015   Diverticulosis 05/21/2015   Elevated PSA 05/21/2015   History of tobacco use 05/21/2015   Recurrent major depressive disorder ([place]) 05/21/2015    Past Medical History:  Diagnosis Date   Depression    History of kidney stones     Past Surgical History:  Procedure Laterality Date   HERNIA REPAIR     JOINT REPLACEMENT N/A    [employer] 08/20/2019   TONSILLECTOMY     TOTAL SHOULDER ARTHROPLASTY Left 02/06/2019   Procedure: TOTAL SHOULDER ARTHROPLASTY;  Surgeon: Nicholes Stairs, MD;  Location: [facility];  Service: Orthopedics;  Laterality: Left;  2.5 hrs    Social History   Socioeconomic History   Marital status: Married    Spouse name: Not on file   Number of children: Not on file   Years of education: Not on file   Highest education level: Not on file  Occupational History   Not on file  Tobacco Use   Smoking status: Former    Types: Cigarettes   Smokeless tobacco: Never  Vaping Use   Vaping Use: Never used  Substance and Sexual Activity   Alcohol use: Yes    Alcohol/week: 5.0 standard drinks    Types: 5 Cans of beer per week   Drug use: Never   Sexual activity: Not on file  Other Topics Concern   Not on file  Social History Narrative   Not on file   Social Determinants of Health   Financial Resource Strain: Not on file  Food Insecurity: Not on file  Transportation Needs: Not on file  Physical Activity: Not on file  Stress: Not on file  Social Connections: Not on file  Intimate Partner Violence: Not on file    Family History  Problem Relation Age of Onset   Alzheimer's disease Mother    Hypertension Father    Heart disease Father      Review of Systems  Constitutional: Negative.   Negative for chills and fever.  HENT: Negative.  Negative for congestion and sore throat.   Respiratory: Negative.  Negative for cough and shortness of breath.   Cardiovascular:  Negative for chest pain and palpitations.  Gastrointestinal:  Negative for abdominal pain, blood in stool, nausea and vomiting.  Genitourinary: Negative.  Negative for dysuria and hematuria.  Skin: Negative.  Negative for rash.  Neurological: Negative.  Negative for dizziness and headaches.  All other systems reviewed and are negative.  Today's Vitals   11/03/20 0916  BP: 118/80  Pulse: (!) 57  Temp: 98.1 F (36.7 C)  TempSrc: Oral  Weight: 185 lb (83.9 kg)  Height: '5\' 9"'$  (1.753 m)  PF: 96 L/min   Body mass index is 27.32 kg/m. Wt Readings from Last 3 Encounters:  11/03/20 185 lb (83.9 kg)  05/05/20 183 lb (83 kg)  01/07/20 179 lb (81.2 kg)    Physical Exam Vitals reviewed.  Constitutional:      Appearance: Normal appearance.  HENT:     Head: Normocephalic.  Eyes:     Extraocular Movements: Extraocular movements intact.     Conjunctiva/sclera: Conjunctivae normal.     Pupils: Pupils are equal, round, and reactive to light.  Cardiovascular:     Rate and Rhythm: Normal rate and regular rhythm.     Pulses: Normal pulses.     Heart sounds: Normal heart sounds.  Pulmonary:     Effort: Pulmonary effort is normal.     Breath sounds: Normal breath sounds.  Musculoskeletal:     Cervical back: Normal range of motion and neck supple.     Right lower leg: No edema.     Left lower leg: No edema.  Skin:    General: Skin is warm and dry.     Capillary Refill: Capillary refill takes less than 2 seconds.  Neurological:     General: No focal deficit present.     Mental Status: He is alert and oriented to person, place, and time.  Psychiatric:        Mood and Affect: Mood normal.        Behavior: Behavior normal.     ASSESSMENT & PLAN: Essential hypertension Well-controlled hypertension continue  losartan 50 mg daily. Dietary approaches to stop hypertension discussed.  Benign enlargement of prostate History of chronically elevated PSA.  Scheduled for prostate MRI.  Diverticulosis Stable.  Needs colon cancer screening.  Scheduled for colonoscopy next November.  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  Recommended surgery by orthopedist.  Quillian Quince was seen today for hypertension.  Diagnoses and all orders for this visit:  Essential hypertension -     CBC with Differential/Platelet -     Comprehensive metabolic panel -     Lipid panel  Diverticulosis  Primary osteoarthritis involving multiple joints  Patient Instructions  Hypertension, Adult  High blood pressure (hypertension) is when the force of blood pumping through the arteries is too strong. The arteries are the blood vessels that carry blood from the heart throughout the body. Hypertension forces the heart to work harder to pump blood and may cause arteries to become narrow or stiff. Untreated or uncontrolled hypertension can cause a heart attack, heart failure, a stroke, kidney disease, and otherproblems. A blood pressure reading consists of a higher number over a lower number. Ideally, your blood pressure should be below 120/80. The first ("top") number is called the systolic pressure. It is a measure of the pressure in your arteries as your heart beats. The second ("bottom") number is called the diastolic pressure. It is a measure of the pressure in your arteries as theheart relaxes. What are the causes? The exact cause of this condition is not known. There are some conditions thatresult in or are related to high blood pressure. What increases the risk? Some risk factors for high blood pressure are under your control. The following factors may make you more likely to develop this condition: Smoking. Having type 2 diabetes mellitus, high cholesterol, or  both. Not getting enough exercise or physical activity. Being overweight. Having too much fat, sugar, calories, or salt (sodium) in your diet. Drinking too much alcohol. Some risk factors for high blood pressure may be difficult or impossible to change. Some of these factors include: Having chronic kidney disease. Having a family history of high blood pressure. Age. Risk increases with age. Race. You may be at higher risk if you are African American. Gender. Men are at higher risk than women before age 60. After age 68, women are at higher risk than men. Having obstructive sleep apnea. Stress. What are the signs or symptoms? High blood pressure may not cause symptoms. Very high blood pressure (hypertensive crisis) may cause: Headache. Anxiety. Shortness of breath. Nosebleed. Nausea and vomiting. Vision changes. Severe chest pain. Seizures. How is this diagnosed? This condition is diagnosed by measuring your blood pressure while you are seated, with your arm resting on a flat surface, your legs uncrossed, and your feet flat on the floor. The cuff of the blood pressure monitor will be placed directly against the skin of your upper arm at the level of your heart. It should be measured at least twice using the same arm. Certain conditions cancause a difference in blood pressure between your right and left arms. Certain factors can cause blood pressure readings to be lower or higher than normal for a short period of time: When your blood pressure is higher when you are in a health care provider's office than when you are at home, this is called white coat hypertension. Most people with this condition do not need medicines. When your blood pressure is higher at home than when you are in a health care provider's office, this is called masked hypertension. Most people with this condition may need medicines to control blood pressure. If you have a high blood pressure reading during one visit or  you have normal blood pressure with other risk factors, you may be asked to: Return on a different day to have your blood pressure checked again. Monitor your blood pressure at home for 1 week or longer. If you are diagnosed with hypertension, you may have other blood or imaging tests to help your health care provider understand your overall risk for otherconditions. How is this treated? This condition is treated by making healthy lifestyle changes, such as eating  healthy foods, exercising more, and reducing your alcohol intake. Your health care provider may prescribe medicine if lifestyle changes are not enough to get your blood pressure under control, and if: Your systolic blood pressure is above 130. Your diastolic blood pressure is above 80. Your personal target blood pressure may vary depending on your medicalconditions, your age, and other factors. Follow these instructions at home: Eating and drinking  Eat a diet that is high in fiber and potassium, and low in sodium, added sugar, and fat. An example eating plan is called the DASH (Dietary Approaches to Stop Hypertension) diet. To eat this way: Eat plenty of fresh fruits and vegetables. Try to fill one half of your plate at each meal with fruits and vegetables. Eat whole grains, such as whole-wheat pasta, brown rice, or whole-grain bread. Fill about one fourth of your plate with whole grains. Eat or drink low-fat dairy products, such as skim milk or low-fat yogurt. Avoid fatty cuts of meat, processed or cured meats, and poultry with skin. Fill about one fourth of your plate with lean proteins, such as fish, chicken without skin, beans, eggs, or tofu. Avoid pre-made and processed foods. These tend to be higher in sodium, added sugar, and fat. Reduce your daily sodium intake. Most people with hypertension should eat less than 1,500 mg of sodium a day. Do not drink alcohol if: Your health care provider tells you not to drink. You are  pregnant, may be pregnant, or are planning to become pregnant. If you drink alcohol: Limit how much you use to: 0-1 drink a day for women. 0-2 drinks a day for men. Be aware of how much alcohol is in your drink. In the [place] one drink equals one 12 oz bottle of beer (355 mL), one 5 oz glass of wine (148 mL), or one 1 oz glass of hard liquor (44 mL).  Lifestyle  Work with your health care provider to maintain a healthy body weight or to lose weight. Ask what an ideal weight is for you. Get at least 30 minutes of exercise most days of the week. Activities may include walking, swimming, or biking. Include exercise to strengthen your muscles (resistance exercise), such as Pilates or lifting weights, as part of your weekly exercise routine. Try to do these types of exercises for 30 minutes at least 3 days a week. Do not use any products that contain nicotine or tobacco, such as cigarettes, e-cigarettes, and chewing tobacco. If you need help quitting, ask your health care provider. Monitor your blood pressure at home as told by your health care provider. Keep all follow-up visits as told by your health care provider. This is important.  Medicines Take over-the-counter and prescription medicines only as told by your health care provider. Follow directions carefully. Blood pressure medicines must be taken as prescribed. Do not skip doses of blood pressure medicine. Doing this puts you at risk for problems and can make the medicine less effective. Ask your health care provider about side effects or reactions to medicines that you should watch for. Contact a health care provider if you: Think you are having a reaction to a medicine you are taking. Have headaches that keep coming back (recurring). Feel dizzy. Have swelling in your ankles. Have trouble with your vision. Get help right away if you: Develop a severe headache or confusion. Have unusual weakness or numbness. Feel faint. Have severe  pain in your chest or abdomen. Vomit repeatedly. Have trouble breathing. Summary Hypertension is when  the force of blood pumping through your arteries is too strong. If this condition is not controlled, it may put you at risk for serious complications. Your personal target blood pressure may vary depending on your medical conditions, your age, and other factors. For most people, a normal blood pressure is less than 120/80. Hypertension is treated with lifestyle changes, medicines, or a combination of both. Lifestyle changes include losing weight, eating a healthy, low-sodium diet, exercising more, and limiting alcohol. This information is not intended to replace advice given to you by your health care provider. Make sure you discuss any questions you have with your healthcare provider. Document Revised: 11/09/2017 Document Reviewed: 11/09/2017 Elsevier Patient Education  2022 Chambers, MD [facility]

## 2020-11-03 NOTE — Patient Instructions (Signed)

## 2020-11-03 NOTE — Assessment & Plan Note (Signed)
Stable.  Needs colon cancer screening.  Scheduled for colonoscopy next November.

## 2020-11-27 ENCOUNTER — Ambulatory Visit
Admission: RE | Admit: 2020-11-27 | Discharge: 2020-11-27 | Disposition: A | Discharge status: Home / Self Care | Payer: BC Managed Care – PPO | Source: Ambulatory Visit | Attending: Urology | Admitting: Urology

## 2020-11-27 DIAGNOSIS — R972 Elevated prostate specific antigen [PSA]: Secondary | ICD-10-CM

## 2020-11-27 MED ORDER — GADOBENATE DIMEGLUMINE 529 MG/ML IV SOLN
16.0000 mL | Freq: Once | INTRAVENOUS | Status: AC | PRN
  Administered 2020-11-27: 16 mL via INTRAVENOUS

## 2020-12-08 ENCOUNTER — Encounter: Payer: Self-pay | Admitting: Dermatology

## 2020-12-08 ENCOUNTER — Other Ambulatory Visit: Payer: Self-pay

## 2020-12-08 ENCOUNTER — Ambulatory Visit: Payer: BC Managed Care – PPO | Admitting: Dermatology

## 2020-12-08 DIAGNOSIS — L570 Actinic keratosis: Secondary | ICD-10-CM | POA: Diagnosis not present

## 2020-12-08 DIAGNOSIS — Z85828 Personal history of other malignant neoplasm of skin: Secondary | ICD-10-CM | POA: Diagnosis not present

## 2020-12-08 DIAGNOSIS — L708 Other acne: Secondary | ICD-10-CM

## 2020-12-08 DIAGNOSIS — L739 Follicular disorder, unspecified: Secondary | ICD-10-CM

## 2020-12-08 MED ORDER — IMIQUIMOD 5 % EX CREA: TOPICAL_CREAM | CUTANEOUS | 0 refills | Status: DC

## 2020-12-08 NOTE — Patient Instructions (Addendum)
Over the counter Benzoyl peroxide apply to scalp daily to as needed  Brand name: BPO Topical antiseptic It can treat acne and other skin conditions. Brands: PanOxyl, Acne-Clear, BP Gel, BenzePrO, Acne Cleansing Bar, BenzEFoam, Acne Medication   Good Rx coupon given for aldara 6 packets less than 10$

## 2020-12-19 ENCOUNTER — Other Ambulatory Visit: Payer: Self-pay

## 2020-12-19 ENCOUNTER — Ambulatory Visit (INDEPENDENT_AMBULATORY_CARE_PROVIDER_SITE_OTHER): Payer: BC Managed Care – PPO

## 2020-12-19 DIAGNOSIS — Z23 Encounter for immunization: Secondary | ICD-10-CM

## 2020-12-19 NOTE — Progress Notes (Signed)
High Dose flu vacc given w/o any complications.

## 2020-12-22 ENCOUNTER — Encounter: Payer: Self-pay | Admitting: Dermatology

## 2020-12-22 NOTE — Progress Notes (Signed)
   Follow-Up Visit   Subjective  Daniel Lucero is a 70 y.o. male who presents for the following: Follow-up (Left arm pink lesion x months and feels like he has raised lesions on the side of the scalp. Last visit skin cancer tx with biopsy on the forehead ).  Crust on scalp, history of skin cancer Location:  Duration:  Quality:  Associated Signs/Symptoms: Modifying Factors:  Severity:  Timing: Context:   Objective  Well appearing patient in no apparent distress; mood and affect are within normal limits. Scalp Multiple small gritty pink crusts  Head Acne like bumps in the back of the scalp  Mid Frontal Scalp No sign recurrence, good cosmesis    All skin waist up examined.   Assessment & Plan    AK (actinic keratosis) Scalp  Aldara coupon given less than 10$ at [facility] for 6 packets.  Goal would be to apply to crusts Monday Wednesday Friday for 6 to 8 weeks or until there is brisk inflammation.  Call if there are any issues.  imiquimod (ALDARA) 5 % cream - Scalp Apply to lesions nightly 3 times a week  Folliculitis Head  Over the counter Benzoyl peroxide apply to scalp daily to as needed  Brand name: BPO Topical antiseptic It can treat acne and other skin conditions. Brands: PanOxyl, Acne-Clear, BP Gel, BenzePrO, Acne Cleansing Bar, BenzEFoam, Acne Medication  Personal history of skin cancer Mid Frontal Scalp  Check as needed change      I, Lavonna Monarch, MD, have reviewed all documentation for this visit.  The documentation on 12/22/20 for the exam, diagnosis, procedures, and orders are all accurate and complete.

## 2021-01-13 IMAGING — DX DG FINGER MIDDLE 2+V*R*
3 series · 3 of 3 positions shown · non-contrast
Comparison: None.

CLINICAL DATA: Crush injury

EXAM:
RIGHT MIDDLE FINGER 2+V

[finger ap]
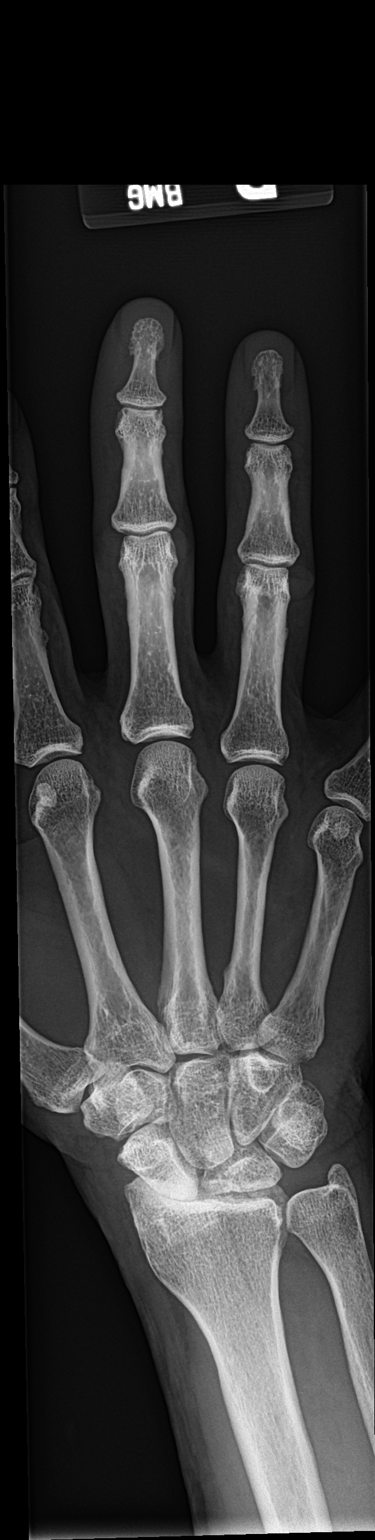

[finger obl]
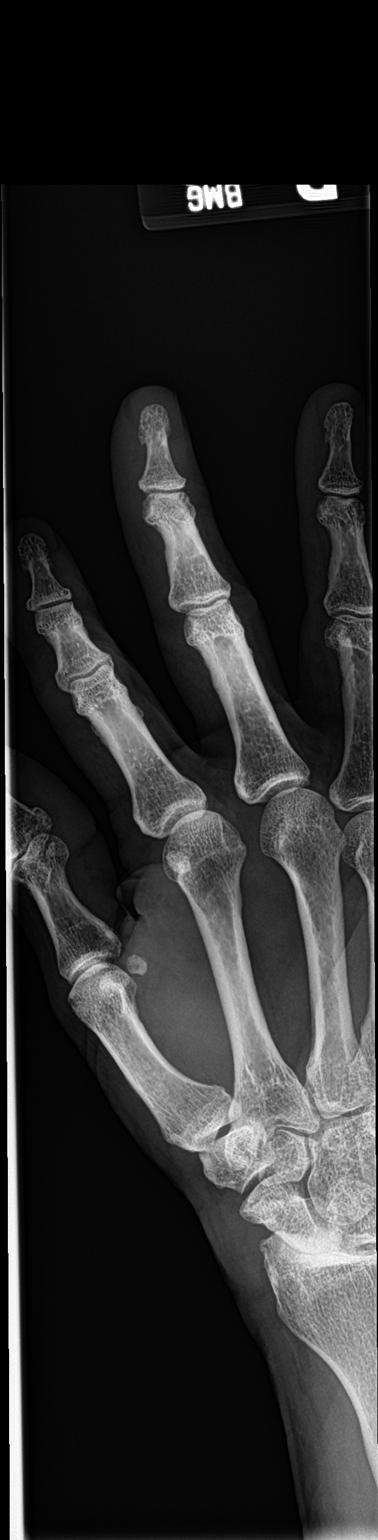

[finger lat]
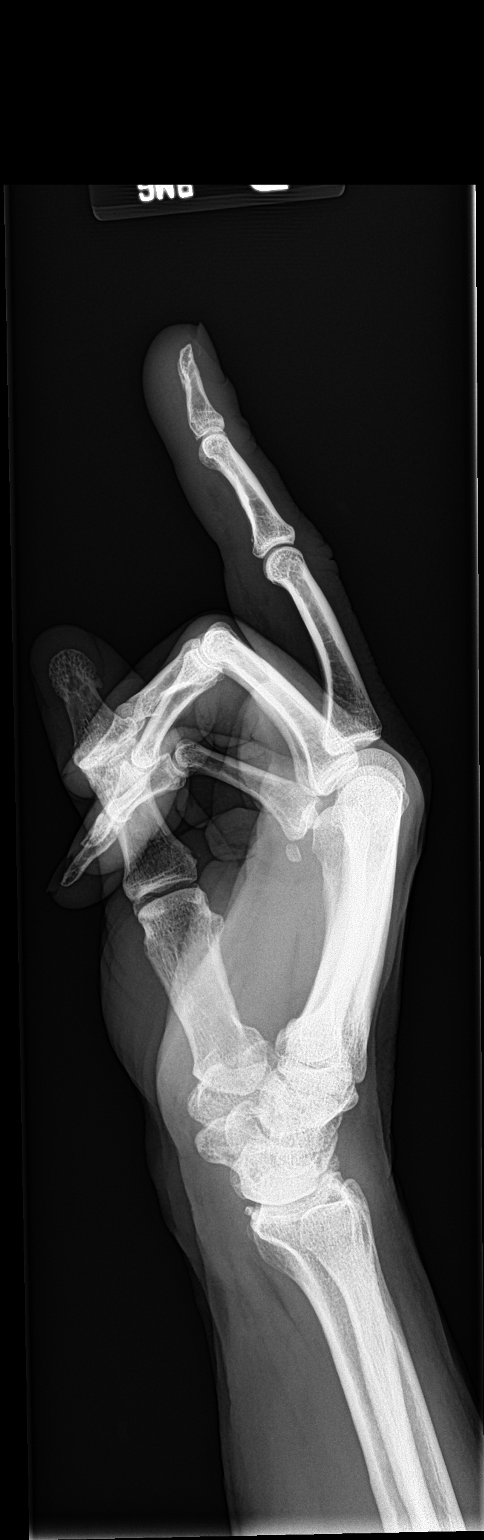

[3 of 3 positions shown; findings below may reference images not displayed]

FINDINGS: There is no evidence of fracture or dislocation. Joint spaces are
preserved. No radiopaque foreign body.
IMPRESSION: No acute fracture.

## 2021-01-27 IMAGING — DX DG SHOULDER 1V*L*
1 series · 1 of 1 positions shown · non-contrast
Comparison: None.

CLINICAL DATA: Status post left shoulder replacement.

EXAM:
LEFT SHOULDER

[shoulder ap]
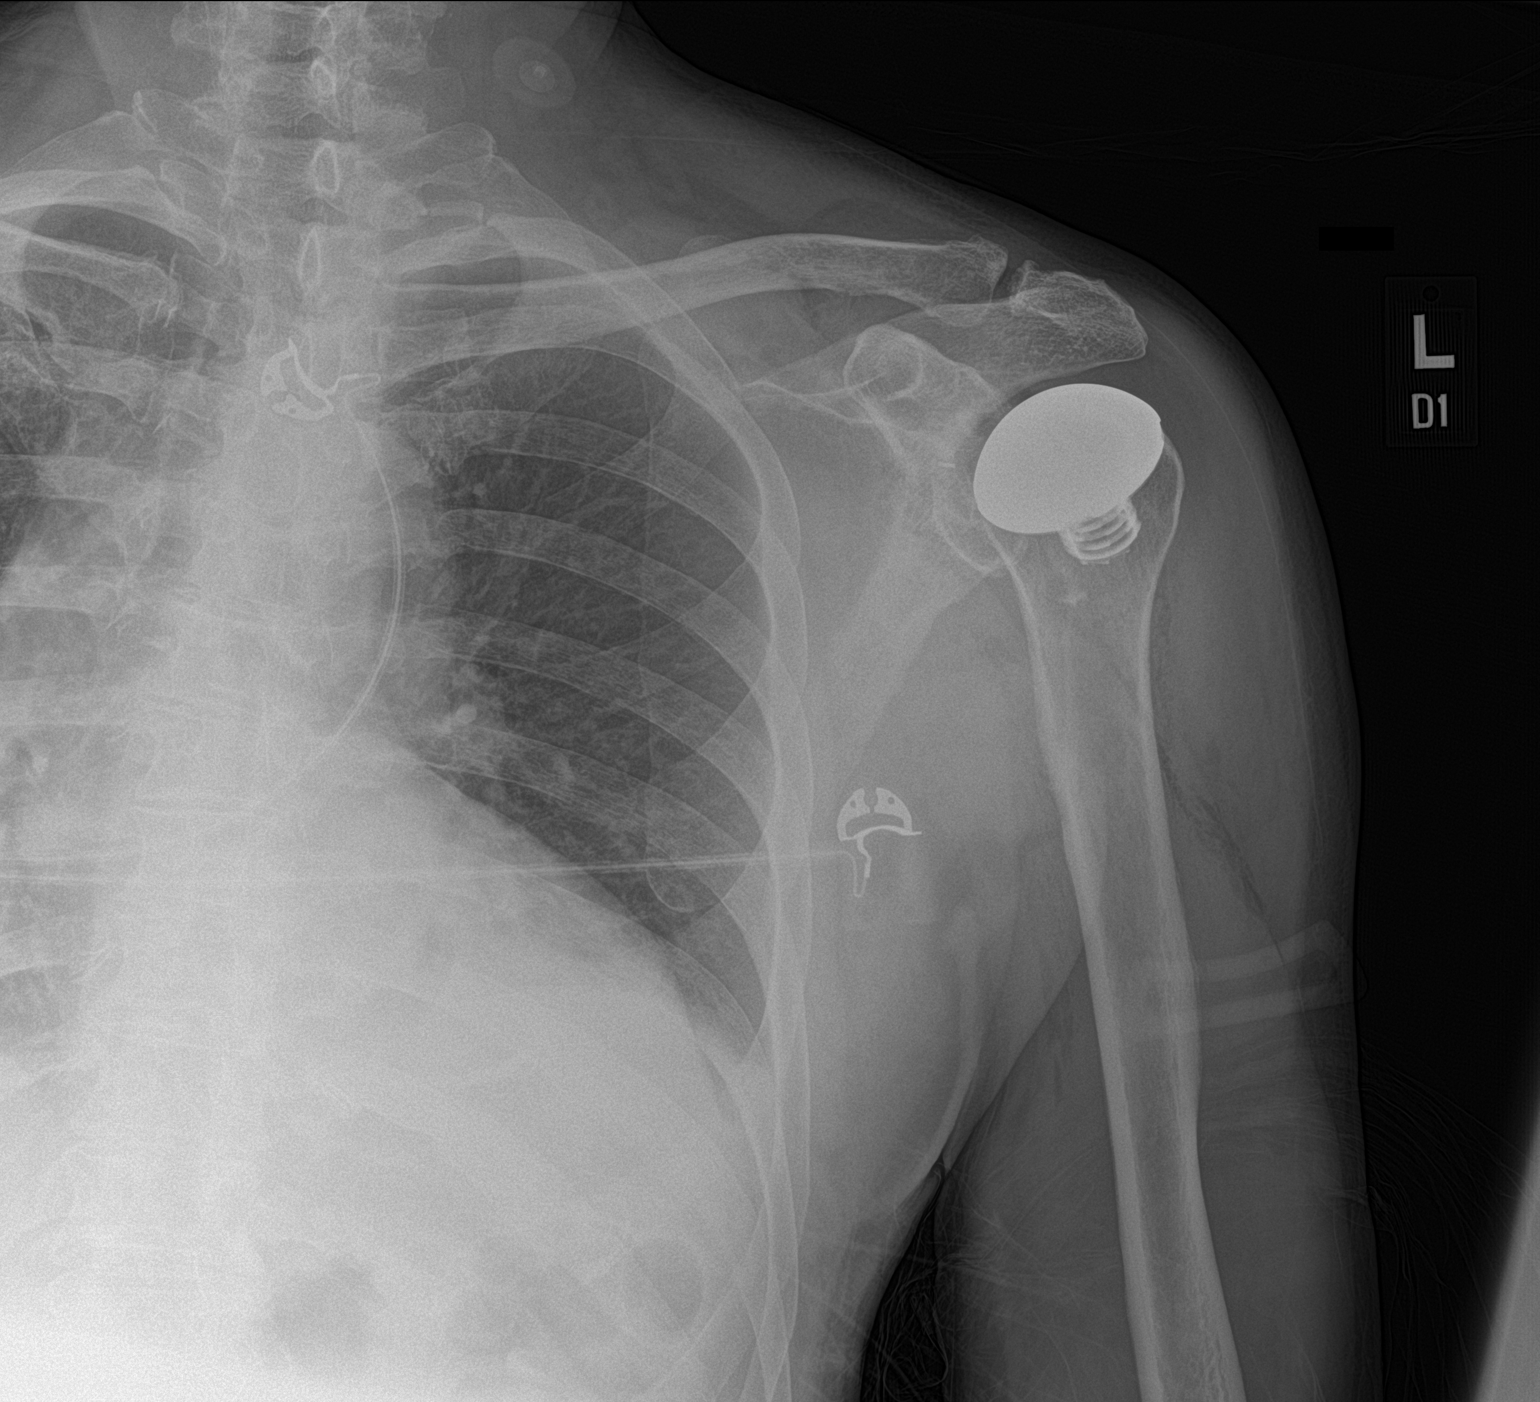

[1 of 1 positions shown; findings below may reference images not displayed]

FINDINGS: Prosthesis appears to be well situated. No fracture or dislocation
is noted. Ribs are unremarkable.
IMPRESSION: Status post left shoulder arthroplasty.

## 2021-04-07 ENCOUNTER — Ambulatory Visit: Payer: BC Managed Care – PPO | Admitting: Registered Nurse

## 2021-04-07 ENCOUNTER — Encounter: Payer: Self-pay | Admitting: Registered Nurse

## 2021-04-07 VITALS — BP 129/92 | HR 62 | Temp 98.3°F | Resp 18 | Ht 69.0 in | Wt 184.2 lb

## 2021-04-07 DIAGNOSIS — R42 Dizziness and giddiness: Secondary | ICD-10-CM | POA: Diagnosis not present

## 2021-04-07 DIAGNOSIS — R001 Bradycardia, unspecified: Secondary | ICD-10-CM

## 2021-04-07 DIAGNOSIS — R9431 Abnormal electrocardiogram [ECG] [EKG]: Secondary | ICD-10-CM

## 2021-04-07 LAB — COMPREHENSIVE METABOLIC PANEL
ALT: 19 U/L (ref 0–53)
AST: 22 U/L (ref 0–37)
Albumin: 4.4 g/dL (ref 3.5–5.2)
Alkaline Phosphatase: 63 U/L (ref 39–117)
BUN: 15 mg/dL (ref 6–23)
CO2: 28 mEq/L (ref 19–32)
Calcium: 9.6 mg/dL (ref 8.4–10.5)
Chloride: 104 mEq/L (ref 96–112)
Creatinine, Ser: 0.95 mg/dL (ref 0.40–1.50)
GFR: 81.66 mL/min (ref >60.00)
Glucose, Bld: 79 mg/dL (ref 70–99)
Potassium: 4.3 mEq/L (ref 3.5–5.1)
Sodium: 140 mEq/L (ref 135–145)
Total Bilirubin: 0.8 mg/dL (ref 0.2–1.2)
Total Protein: 6.8 g/dL (ref 6.0–8.3)

## 2021-04-07 LAB — CBC WITH DIFFERENTIAL/PLATELET
Basophils Absolute: 0 10*3/uL (ref 0.0–0.1)
Basophils Relative: 0.8 % (ref 0.0–3.0)
Eosinophils Absolute: 0.3 10*3/uL (ref 0.0–0.7)
Eosinophils Relative: 5 % (ref 0.0–5.0)
HCT: 42.9 % (ref 39.0–52.0)
Hemoglobin: 14.7 g/dL (ref 13.0–17.0)
Lymphocytes Relative: 25.1 % (ref 12.0–46.0)
Lymphs Abs: 1.3 10*3/uL (ref 0.7–4.0)
MCHC: 34.1 g/dL (ref 30.0–36.0)
MCV: 90.9 fl (ref 78.0–100.0)
Monocytes Absolute: 0.5 10*3/uL (ref 0.1–1.0)
Monocytes Relative: 10.6 % (ref 3.0–12.0)
Neutro Abs: 3 10*3/uL (ref 1.4–7.7)
Neutrophils Relative %: 58.5 % (ref 43.0–77.0)
Platelets: 212 10*3/uL (ref 150.0–400.0)
RBC: 4.72 Mil/uL (ref 4.22–5.81)
RDW: 12.5 % (ref 11.5–15.5)
WBC: 5.1 10*3/uL (ref 4.0–10.5)

## 2021-04-07 LAB — LIPID PANEL
Cholesterol: 140 mg/dL (ref 0–200)
HDL: 47.1 mg/dL (ref >39.00)
LDL Cholesterol: 79 mg/dL (ref 0–99)
NonHDL: 92.55
Total CHOL/HDL Ratio: 3
Triglycerides: 66 mg/dL (ref 0.0–149.0)
VLDL: 13.2 mg/dL (ref 0.0–40.0)

## 2021-04-07 LAB — TSH: TSH: 1.97 u[IU]/mL (ref 0.35–5.50)

## 2021-04-07 LAB — BRAIN NATRIURETIC PEPTIDE: Pro B Natriuretic peptide (BNP): 33 pg/mL (ref 0.0–100.0)

## 2021-04-07 NOTE — Progress Notes (Signed)
Established Patient Office Visit  Subjective:  Patient ID: Daniel Lucero, male    DOB: March 30, 1950  Age: 71 y.o. MRN: [id number]  CC:  Chief Complaint  Patient presents with   Dizziness    Patient states he woke up yesterday with some dizziness, lightheaded and dry mouth.    HPI Daniel Lucero [facility] presents for dizziness  Woke up yesterday with acute onset dizziness, lightheaded, dry mouth. Felt the same today. No hx of these symptoms.  Throughout the day has felt "spacey", "not quite right", "lightheaded"  Does note some cramping in lower legs on and off.  Feels like leg "wants to cramp", L>R.   No orthopnea. No lifestyle changes  Past Medical History:  Diagnosis Date   Depression    History of kidney stones    Squamous cell carcinoma of skin 05/2020   well diff scc right forehead tx with bx    Past Surgical History:  Procedure Laterality Date   HERNIA REPAIR     JOINT REPLACEMENT N/A    Phreesia 08/20/2019   TONSILLECTOMY     TOTAL SHOULDER ARTHROPLASTY Left 02/06/2019   Procedure: TOTAL SHOULDER ARTHROPLASTY;  Surgeon: Nicholes Stairs, MD;  Location: [place];  Service: Orthopedics;  Laterality: Left;  2.5 hrs    Family History  Problem Relation Age of Onset   Alzheimer's disease Mother    Hypertension Father    Heart disease Father     Social History   Socioeconomic History   Marital status: Married    Spouse name: Not on file   Number of children: Not on file   Years of education: Not on file   Highest education level: Not on file  Occupational History   Not on file  Tobacco Use   Smoking status: Former    Types: Cigarettes   Smokeless tobacco: Never  Vaping Use   Vaping Use: Never used  Substance and Sexual Activity   Alcohol use: Yes    Alcohol/week: 5.0 standard drinks    Types: 5 Cans of beer per week   Drug use: Never   Sexual activity: Not on file  Other Topics Concern   Not on file  Social History Narrative   Not on file    Social Determinants of Health   Financial Resource Strain: Not on file  Food Insecurity: Not on file  Transportation Needs: Not on file  Physical Activity: Not on file  Stress: Not on file  Social Connections: Not on file  Intimate Partner Violence: Not on file    Outpatient Medications Prior to Visit  Medication Sig Dispense Refill   imiquimod (ALDARA) 5 % cream Apply to lesions nightly 3 times a week 6 each 0   losartan (COZAAR) 50 MG tablet Take 1 tablet (50 mg total) by mouth daily. 90 tablet 3   tamsulosin (FLOMAX) 0.4 MG CAPS capsule Take 1 capsule by mouth daily.     tamsulosin (FLOMAX) 0.4 MG CAPS capsule Take 0.4 mg by mouth at bedtime.     No facility-administered medications prior to visit.    Allergies  Allergen Reactions   Penicillins Rash and Other (See Comments)    Has patient had a PCN reaction causing immediate rash, facial/tongue/throat swelling, SOB or lightheadedness with hypotension: Yes Has patient had a PCN reaction causing severe rash involving mucus membranes or skin necrosis: No Has patient had a PCN reaction that required hospitalization: No Has patient had a PCN reaction occurring within the last 10 years:  No If all of the above answers are "NO", then may proceed with Cephalosporin use.    ROS Review of Systems  Constitutional: Negative.   HENT: Negative.    Eyes: Negative.   Respiratory: Negative.    Cardiovascular: Negative.   Gastrointestinal: Negative.   Genitourinary: Negative.   Musculoskeletal: Negative.   Skin: Negative.   Neurological: Negative.   Psychiatric/Behavioral: Negative.       Objective:    Physical Exam Constitutional:      General: He is not in acute distress.    Appearance: Normal appearance. He is normal weight. He is not ill-appearing, toxic-appearing or diaphoretic.  Cardiovascular:     Rate and Rhythm: Normal rate and regular rhythm.     Heart sounds: Normal heart sounds. No murmur heard.   No friction  rub. No gallop.  Pulmonary:     Effort: Pulmonary effort is normal. No respiratory distress.     Breath sounds: Normal breath sounds. No stridor. No wheezing, rhonchi or rales.  Chest:     Chest wall: No tenderness.  Neurological:     General: No focal deficit present.     Mental Status: He is alert and oriented to person, place, and time. Mental status is at baseline.  Psychiatric:        Mood and Affect: Mood normal.        Behavior: Behavior normal.        Thought Content: Thought content normal.        Judgment: Judgment normal.    BP (!) 129/92    Pulse 62    Temp 98.3 F (36.8 C) (Temporal)    Resp 18    Ht 5\' 9"  (1.753 m)    Wt 184 lb 3.2 oz (83.6 kg)    SpO2 100%    BMI 27.20 kg/m  Wt Readings from Last 3 Encounters:  04/07/21 184 lb 3.2 oz (83.6 kg)  11/03/20 185 lb (83.9 kg)  05/05/20 183 lb (83 kg)     Health Maintenance Due  Topic Date Due   Hepatitis C Screening  Never done   Zoster Vaccines- Shingrix (1 of 2) Never done   Pneumonia Vaccine 108+ Years old (2 - PPSV23 if available, else PCV20) 12/15/2017   COVID-19 Vaccine (6 - Booster for Pfizer series) 01/17/2021    There are no preventive care reminders to display for this patient.  Lab Results  Component Value Date   TSH 1.97 04/07/2021   Lab Results  Component Value Date   WBC 5.1 04/07/2021   HGB 14.7 04/07/2021   HCT 42.9 04/07/2021   MCV 90.9 04/07/2021   PLT 212.0 04/07/2021   Lab Results  Component Value Date   NA 140 04/07/2021   K 4.3 04/07/2021   CO2 28 04/07/2021   GLUCOSE 79 04/07/2021   BUN 15 04/07/2021   CREATININE 0.95 04/07/2021   BILITOT 0.8 04/07/2021   ALKPHOS 63 04/07/2021   AST 22 04/07/2021   ALT 19 04/07/2021   PROT 6.8 04/07/2021   ALBUMIN 4.4 04/07/2021   CALCIUM 9.6 04/07/2021   ANIONGAP 10 02/02/2019   GFR 81.66 04/07/2021   Lab Results  Component Value Date   CHOL 140 04/07/2021   Lab Results  Component Value Date   HDL 47.10 04/07/2021   Lab Results   Component Value Date   LDLCALC 79 04/07/2021   Lab Results  Component Value Date   TRIG 66.0 04/07/2021   Lab Results  Component Value Date  CHOLHDL 3 04/07/2021   No results found for: HGBA1C    Assessment & Plan:   Problem List Items Addressed This Visit   None Visit Diagnoses     Dizziness    -  Primary   Relevant Orders   EKG 12-Lead (Completed)   CBC with Differential/Platelet (Completed)   Comprehensive metabolic panel (Completed)   Lipid panel (Completed)   TSH (Completed)   Ambulatory referral to Cardiology   Brain natriuretic peptide (Completed)   Sinus bradycardia       Relevant Orders   CBC with Differential/Platelet (Completed)   Comprehensive metabolic panel (Completed)   Lipid panel (Completed)   TSH (Completed)   Ambulatory referral to Cardiology   Brain natriuretic peptide (Completed)   Abnormal EKG       Relevant Orders   CBC with Differential/Platelet (Completed)   Comprehensive metabolic panel (Completed)   Lipid panel (Completed)   TSH (Completed)   Ambulatory referral to Cardiology   Brain natriuretic peptide (Completed)       No orders of the defined types were placed in this encounter.   Follow-up: No follow-ups on file.   PLAN EKG completed. No comparison available. Sinus bradycardia. Question of abnormal QRS complexes in a number of leads, concern for block. Will refer to cardiology.  No red flag symptoms but discussed reasons to present to ED with patient Labs collected. Will follow up with the patient as warranted. Patient encouraged to call clinic with any questions, comments, or concerns.  Maximiano Coss, NP

## 2021-04-07 NOTE — Patient Instructions (Addendum)
Daniel Lucero -   Doristine Devoid to meet you  Let's get an opinion from Cardiology . I'll have them call you  I'll let you know if labs look concerning  Thank you  Rich     If you have lab work done today you will be contacted with your lab results within the next 2 weeks.  If you have not heard from [place] then please contact us. The fastest way to get your results is to register for My Chart.   IF you received an x-ray today, you will receive an invoice from [facility]. Please contact [facility] at [phone] with questions or concerns regarding your invoice.   IF you received labwork today, you will receive an invoice from [place]. Please contact [facility] at [phone] with questions or concerns regarding your invoice.   Our billing staff will not be able to assist you with questions regarding bills from these companies.  You will be contacted with the lab results as soon as they are available. The fastest way to get your results is to activate your My Chart account. Instructions are located on the last page of this paperwork. If you have not heard from [place] regarding the results in 2 weeks, please contact this office.

## 2021-04-08 ENCOUNTER — Encounter: Payer: Self-pay | Admitting: Registered Nurse

## 2021-04-09 ENCOUNTER — Ambulatory Visit: Payer: BC Managed Care – PPO | Admitting: Nurse Practitioner

## 2021-04-13 NOTE — Addendum Note (Signed)
Addended by: Hildred Priest on: 04/13/2021 04:06 PM   Modules accepted: Orders

## 2021-04-24 NOTE — Progress Notes (Signed)
Cardiology Office Note:    Date:  04/27/2021   ID:  DETAVIOUS Lucero, DOB 15-Jul-1950, MRN [id number]  PCP:  Horald Pollen, MD  Cardiologist:  Buford Dresser, MD  Referring MD: Maximiano Coss, NP   CC: new patient consultation for dizziness and abnormal ECG  History of Present Illness:    Daniel Lucero is a 71 y.o. male with a hx of squamous cell carcinoma of skin, nephrolithiasis, and depression, who is seen as a new consult at the request of Maximiano Coss, NP for the evaluation and management of dizziness, sinus bradycardia, and abnormal EKG.  CV history: [name] saw Maximiano Coss, NP on 04/07/2021 for acute dizziness, lightheadedness, and dry mouth since the day prior. He also reported LE muscle cramping L>R. EKG at that visit showed sinus bradycardia, and question of abnormal QRS complexes in a number of leads with concern for block.  Cardiovascular risk factors: Prior clinical ASCVD: None. Comorbid conditions: Hypertension - On losartan for 1.5 years, since his diagnosis. Metabolic syndrome/Obesity: Highest adult weight is his current weight, 187 lbs. Chronic inflammatory conditions: Tobacco use history: Former cigarette smoker, 1/2 ppd at peak. Quit in 2007-2008. Currently smokes cigars 1-3 times a week. Family history: His father died of hypertension at 36 yo. Prior cardiac testing and/or incidental findings on other testing (ie coronary calcium): None. Exercise level: Previously lifted weights, running. Normally he walks regularly about 3 miles at a brisk pace, 4-5 days a week, prior to his dizziness. He was advised to slow his pace and frequency until the etiology of his dizziness was found. Current diet: "A lot." Meat, vegetables, fairly balanced. Since COVID restrictions he has been eating more sweets.  Lightheadedness ongoing for a while. However, he then woke up twice in the middle of the night with room-spinning dizziness. This was severe  enough to prevent him from walking. He had associated diaphoresis. Usually he would sleep flat in his bed. After the dizziness developed he sleeps a little propped up which seems to help. His episodes have not been as severe as his initial episode. The latest episode occurred last night. He also notices the dizziness when bending over to tie his shoes, but this resolves spontaneously when righting himself. He denies any syncope.  At home his blood pressure is usually elevated during his episodes of dizziness, such as 150/90. He is taking 325 mg aspirin maybe 3-4 nights a week for lightheadedness and lowering blood pressure.  He endorses frequent night sweats that have been ongoing for a while.  Sometimes he has intermittent shortness of breath. He took a walk yesterday, and noticed mild shortness of breath. This resolved with a short rest and he continued walking.  He denies any palpitations, chest pain, or peripheral edema. No headaches, orthopnea, or PND.  Past Medical History:  Diagnosis Date   Depression    History of kidney stones    Squamous cell carcinoma of skin 05/2020   well diff scc right forehead tx with bx    Past Surgical History:  Procedure Laterality Date   HERNIA REPAIR     JOINT REPLACEMENT N/A    Phreesia 08/20/2019   TONSILLECTOMY     TOTAL SHOULDER ARTHROPLASTY Left 02/06/2019   Procedure: TOTAL SHOULDER ARTHROPLASTY;  Surgeon: Nicholes Stairs, MD;  Location: [place];  Service: Orthopedics;  Laterality: Left;  2.5 hrs    Current Medications: Current Outpatient Medications on File Prior to Visit  Medication Sig   aspirin 325 MG EC  tablet Take 325 mg by mouth daily.   losartan (COZAAR) 50 MG tablet Take 1 tablet (50 mg total) by mouth daily.   tamsulosin (FLOMAX) 0.4 MG CAPS capsule Take 1 capsule by mouth daily.   No current facility-administered medications on file prior to visit.     Allergies:   Penicillins   Social History   Tobacco Use   Smoking  status: Some Days    Types: Cigars   Smokeless tobacco: Never   Tobacco comments:    Former cigarettes, quit 2007. cigars 1-3 times/week  Vaping Use   Vaping Use: Never used  Substance Use Topics   Alcohol use: Yes    Alcohol/week: 5.0 standard drinks    Types: 5 Cans of beer per week   Drug use: Never    Family History: family history includes Alzheimer's disease in his mother; Heart disease in his father; Hypertension in his father.  ROS:   Please see the history of present illness.  Additional pertinent ROS: Constitutional: Negative for chills, fever, unintentional weight loss. Positive for night sweats. HENT: Negative for ear pain and hearing loss.   Eyes: Negative for loss of vision and eye pain.  Respiratory: Negative for cough, sputum, wheezing. Positive for shortness of breath.   Cardiovascular: See HPI. Gastrointestinal: Negative for abdominal pain, melena, and hematochezia.  Genitourinary: Negative for dysuria and hematuria.  Musculoskeletal: Negative for falls and myalgias.  Skin: Negative for itching and rash.  Neurological: Negative for focal weakness, focal sensory changes and loss of consciousness. Positive for dizziness. Endo/Heme/Allergies: Does not bruise/bleed easily.     EKGs/Labs/Other Studies Reviewed:    The following studies were reviewed today:  No prior cardiovascular studies available.   EKG:  EKG is personally reviewed.   04/27/2021: sinus bradycardia at 55 bpm, QRS 92 ms (borderline IVCD)  Recent Labs: 04/07/2021: ALT 19; BUN 15; Creatinine, Ser 0.95; Hemoglobin 14.7; Platelets 212.0; Potassium 4.3; Pro B Natriuretic peptide (BNP) 33.0; Sodium 140; TSH 1.97   Recent Lipid Panel    Component Value Date/Time   CHOL 140 04/07/2021 1433   CHOL 134 09/03/2019 0800   TRIG 66.0 04/07/2021 1433   HDL 47.10 04/07/2021 1433   HDL 55 09/03/2019 0800   CHOLHDL 3 04/07/2021 1433   VLDL 13.2 04/07/2021 1433   LDLCALC 79 04/07/2021 1433   LDLCALC 69  09/03/2019 0800    Physical Exam:    VS:  BP 120/76 (BP Location: Left Arm, Patient Position: Sitting, Cuff Size: Normal)    Pulse (!) 55    Ht 5\' 9"  (1.753 m)    Wt 187 lb 14.4 oz (85.2 kg)    BMI 27.75 kg/m     Orthostatic VS for the past 24 hrs (Last 3 readings):  BP- Lying Pulse- Lying BP- Sitting Pulse- Sitting BP- Standing at 0 minutes Pulse- Standing at 0 minutes BP- Standing at 3 minutes Pulse- Standing at 3 minutes  04/27/21 0846 134/80 52 135/69 59 137/85 55 151/81 53     Wt Readings from Last 3 Encounters:  04/27/21 187 lb 14.4 oz (85.2 kg)  04/07/21 184 lb 3.2 oz (83.6 kg)  11/03/20 185 lb (83.9 kg)    GEN: Well nourished, well developed in no acute distress HEENT: Normal, moist mucous membranes NECK: No JVD CARDIAC: regular rhythm, normal S1 and S2, no rubs or gallops. No murmur. VASCULAR: Radial and DP pulses 2+ bilaterally. No carotid bruits RESPIRATORY:  Clear to auscultation without rales, wheezing or rhonchi  ABDOMEN: Soft,  non-tender, non-distended MUSCULOSKELETAL:  Ambulates independently SKIN: Warm and dry, no edema NEUROLOGIC:  Alert and oriented x 3. No focal neuro deficits noted. PSYCHIATRIC:  Normal affect    ASSESSMENT:    1. Dizziness   2. Essential hypertension   3. History of cigar smoking   4. Abnormal ECG   5. Cardiac risk counseling   6. Counseling on health promotion and disease prevention    PLAN:    Dizziness: -symptoms are positional (lying flat), woke from sleep, feels like room is spinning. This is much more suggestive of something like BPPV vs cardiac etiology. I recommended he reach out to his PCP to see if vestibular PT/Epley maneuvers might be helpful -I personally reviewed ecg from 04/07/21. Sinus bradycardia, nonspecific conduction delay. -ECG similar today -othostatics negative today  Hypertension: -at goal today, continue losartan  Abnormal ECG: -borderline IVCD, no high risk features  Cigar smoking: -we did discuss  that there is CV risk with this. He is not interested in quitting at this time.  Cardiac risk counseling and prevention recommendations: -recommend heart healthy/Mediterranean diet, with whole grains, fruits, vegetable, fish, lean meats, nuts, and olive oil. Limit salt. -recommend moderate walking, 3-5 times/week for 30-50 minutes each session. Aim for at least 150 minutes.week. Goal should be pace of 3 miles/hours, or walking 1.5 miles in 30 minutes -recommend avoidance of tobacco products. Avoid excess alcohol. -ASCVD risk score: The 10-year ASCVD risk score (Arnett DK, et al., 2019) is: 18.6%   Values used to calculate the score:     Age: 46 years     Sex: Male     Is Non-Hispanic African American: No     Diabetic: No     Tobacco smoker: Yes     Systolic Blood Pressure: 326 mmHg     Is BP treated: Yes     HDL Cholesterol: 47.1 mg/dL     Total Cholesterol: 140 mg/dL    He is on aspirin per personal preference. He does not meet guidelines for routine aspirin for primary prevention, but if he wishes to continue I would consider dropping to 81 mg dose.  Plan for follow up: PRN.  Buford Dresser, MD, PhD, [facility]    Medication Adjustments/Labs and Tests Ordered: Current medicines are reviewed at length with the patient today.  Concerns regarding medicines are outlined above.   Orders Placed This Encounter  Procedures   EKG 12-Lead   No orders of the defined types were placed in this encounter.  Patient Instructions  Medication Instructions:  Your Physician recommend you continue on your current medication as directed.    *If you need a refill on your cardiac medications before your next appointment, please call your pharmacy*   Lab Work: None ordered today   Testing/Procedures: None ordered today   Follow-Up: At [facility], you and your health needs are our priority.  As part of our continuing mission to provide you with exceptional  heart care, we have created designated Provider Care Teams.  These Care Teams include your primary Cardiologist (physician) and Advanced Practice Providers (APPs -  Physician Assistants and Nurse Practitioners) who all work together to provide you with the care you need, when you need it.  We recommend signing up for the patient portal called "MyChart".  Sign up information is provided on this After Visit Summary.  MyChart is used to connect with patients for Virtual Visits (Telemedicine).  Patients are able to view lab/test results, encounter  notes, upcoming appointments, etc.  Non-urgent messages can be sent to your provider as well.   To learn more about what you can do with MyChart, go to [email or web address].    Your next appointment:   As needed  The format for your next appointment:   In Person  Provider:   Buford Dresser, MD        [facility]ump[facility],acting as a scribe for Buford Dresser, MD.,have documented all relevant documentation on the behalf of Buford Dresser, MD,as directed by  Buford Dresser, MD while in the presence of Buford Dresser, MD.  I, Buford Dresser, MD, have reviewed all documentation for this visit. The documentation on 04/27/21 for the exam, diagnosis, procedures, and orders are all accurate and complete.   Signed, Buford Dresser, MD PhD 04/27/2021 9:52 AM    [place]

## 2021-04-27 ENCOUNTER — Encounter (HOSPITAL_BASED_OUTPATIENT_CLINIC_OR_DEPARTMENT_OTHER): Payer: Self-pay | Admitting: Cardiology

## 2021-04-27 ENCOUNTER — Other Ambulatory Visit: Payer: Self-pay

## 2021-04-27 ENCOUNTER — Ambulatory Visit (HOSPITAL_BASED_OUTPATIENT_CLINIC_OR_DEPARTMENT_OTHER): Payer: BC Managed Care – PPO | Admitting: Cardiology

## 2021-04-27 VITALS — BP 120/76 | HR 55 | Ht 69.0 in | Wt 187.9 lb

## 2021-04-27 DIAGNOSIS — R42 Dizziness and giddiness: Secondary | ICD-10-CM

## 2021-04-27 DIAGNOSIS — R9431 Abnormal electrocardiogram [ECG] [EKG]: Secondary | ICD-10-CM

## 2021-04-27 DIAGNOSIS — I10 Essential (primary) hypertension: Secondary | ICD-10-CM

## 2021-04-27 DIAGNOSIS — Z87891 Personal history of nicotine dependence: Secondary | ICD-10-CM | POA: Diagnosis not present

## 2021-04-27 DIAGNOSIS — Z7189 Other specified counseling: Secondary | ICD-10-CM

## 2021-04-27 NOTE — Patient Instructions (Signed)

## 2021-04-29 ENCOUNTER — Ambulatory Visit: Payer: BC Managed Care – PPO | Admitting: Internal Medicine

## 2021-04-29 ENCOUNTER — Other Ambulatory Visit: Payer: Self-pay

## 2021-04-29 VITALS — BP 110/74 | HR 62 | Temp 97.8°F | Ht 69.0 in | Wt 185.4 lb

## 2021-04-29 DIAGNOSIS — J069 Acute upper respiratory infection, unspecified: Secondary | ICD-10-CM | POA: Diagnosis not present

## 2021-04-29 DIAGNOSIS — H9191 Unspecified hearing loss, right ear: Secondary | ICD-10-CM | POA: Diagnosis not present

## 2021-04-29 DIAGNOSIS — J309 Allergic rhinitis, unspecified: Secondary | ICD-10-CM

## 2021-04-29 DIAGNOSIS — R42 Dizziness and giddiness: Secondary | ICD-10-CM

## 2021-04-29 MED ORDER — FLUTICASONE PROPIONATE 50 MCG/ACT NA SUSP: 2.0000 | Freq: Every day | NASAL | 6 refills | Status: AC

## 2021-04-29 MED ORDER — MECLIZINE HCL 12.5 MG PO TABS: 12.5000 mg | ORAL_TABLET | Freq: Three times a day (TID) | ORAL | 2 refills | Status: DC | PRN

## 2021-04-29 MED ORDER — DOXYCYCLINE HYCLATE 100 MG PO TABS: 100.0000 mg | ORAL_TABLET | Freq: Two times a day (BID) | ORAL | 0 refills | Status: DC

## 2021-04-29 NOTE — Patient Instructions (Signed)
Please take all new medication as prescribed - the antibiotic, meclizine for vertigo, and flonase  Your Right ear was irrigated of wax  Please continue all other medications as before  Please have the pharmacy call with any other refills you may need.  Please keep your appointments with your specialists as you may have planned

## 2021-04-29 NOTE — Progress Notes (Signed)
Patient ID: Daniel Lucero [facility], male   DOB: 1951/01/27, 71 y.o.   MRN: [id number]        Chief Complaint: follow up uri symptoms, allergies, vertigo and right hearing loss       HPI:  Daniel Lucero is a 71 y.o. male  Here with 2-3 days acute onset fever, facial pain, pressure, headache, general weakness and malaise, and greenish d/c, with mild ST and cough, but pt denies chest pain, wheezing, increased sob or doe, orthopnea, PND, increased LE swelling, palpitations, or syncope, but does also have positional vertigo mild intermittent a well for several days.. Also Does have several wks ongoing nasal allergy symptoms with clearish congestion, itch and sneezing, without fever, pain, ST, cough, swelling or wheezing.  Also has right hearing loss with possible wax impaction for several days.        Wt Readings from Last 3 Encounters:  04/29/21 185 lb 6 oz (84.1 kg)  04/27/21 187 lb 14.4 oz (85.2 kg)  04/07/21 184 lb 3.2 oz (83.6 kg)   BP Readings from Last 3 Encounters:  04/29/21 110/74  04/27/21 120/76  04/07/21 (!) 129/92         Past Medical History:  Diagnosis Date   Depression    History of kidney stones    Squamous cell carcinoma of skin 05/2020   well diff scc right forehead tx with bx   Past Surgical History:  Procedure Laterality Date   HERNIA REPAIR     JOINT REPLACEMENT N/A    Phreesia 08/20/2019   TONSILLECTOMY     TOTAL SHOULDER ARTHROPLASTY Left 02/06/2019   Procedure: TOTAL SHOULDER ARTHROPLASTY;  Surgeon: Nicholes Stairs, MD;  Location: [place];  Service: Orthopedics;  Laterality: Left;  2.5 hrs    reports that he has been smoking cigars. He has never used smokeless tobacco. He reports current alcohol use of about 5.0 standard drinks per week. He reports that he does not use drugs. family history includes Alzheimer's disease in his mother; Heart disease in his father; Hypertension in his father. Allergies  Allergen Reactions   Penicillins Rash and Other (See  Comments)    Has patient had a PCN reaction causing immediate rash, facial/tongue/throat swelling, SOB or lightheadedness with hypotension: Yes Has patient had a PCN reaction causing severe rash involving mucus membranes or skin necrosis: No Has patient had a PCN reaction that required hospitalization: No Has patient had a PCN reaction occurring within the last 10 years: No If all of the above answers are "NO", then may proceed with Cephalosporin use.   Current Outpatient Medications on File Prior to Visit  Medication Sig Dispense Refill   aspirin 325 MG EC tablet Take 325 mg by mouth daily.     tamsulosin (FLOMAX) 0.4 MG CAPS capsule Take 1 capsule by mouth daily.     No current facility-administered medications on file prior to visit.        ROS:  All others reviewed and negative.  Objective        PE:  BP 110/74    Pulse 62    Temp 97.8 F (36.6 C) (Oral)    Ht 5\' 9"  (1.753 m)    Wt 185 lb 6 oz (84.1 kg)    SpO2 97%    BMI 27.38 kg/m                 Constitutional: Pt appears in NAD  HENT: Head: NCAT.                Right Ear: External ear normal.                 Left Ear: External ear normal. Bilat tm's with mild erythema.  Max sinus areas mild tender.  Pharynx with mild erythema, no exudate;  right wax impaction resolved with irrigation and hearing improved               Eyes: . Pupils are equal, round, and reactive to light. Conjunctivae and EOM are normal               Nose: without d/c or deformity               Neck: Neck supple. Gross normal ROM               Cardiovascular: Normal rate and regular rhythm.                 Pulmonary/Chest: Effort normal and breath sounds without rales or wheezing.                Abd:  Soft, NT, ND, + BS, no organomegaly               Neurological: Pt is alert. At baseline orientation, motor grossly intact               Skin: Skin is warm. No rashes, no other new lesions, LE edema - none               Psychiatric: Pt behavior is  normal without agitation   Micro: none  Cardiac tracings I have personally interpreted today:  none  Pertinent Radiological findings (summarize): none   Lab Results  Component Value Date   WBC 5.1 04/07/2021   HGB 14.7 04/07/2021   HCT 42.9 04/07/2021   PLT 212.0 04/07/2021   GLUCOSE 79 04/07/2021   CHOL 140 04/07/2021   TRIG 66.0 04/07/2021   HDL 47.10 04/07/2021   LDLCALC 79 04/07/2021   ALT 19 04/07/2021   AST 22 04/07/2021   NA 140 04/07/2021   K 4.3 04/07/2021   CL 104 04/07/2021   CREATININE 0.95 04/07/2021   BUN 15 04/07/2021   CO2 28 04/07/2021   TSH 1.97 04/07/2021   Assessment/Plan:  Daniel Lucero is a 71 y.o. White or Caucasian [1] male with  has a past medical history of Depression, History of kidney stones, and Squamous cell carcinoma of skin (05/2020).  Acute hearing loss, right  right wax impaction resolved with irrigation and hearing improved  Ceruminosis is noted.  Wax is removed by syringing and manual debridement. Instructions for home care to prevent wax buildup are given.   Upper respiratory infection Mild to mod, for antibx course,  to f/u any worsening symptoms or concerns  Allergic rhinitis Mild to mod, for flonase restart,  to f/u any worsening symptoms or concerns  Vertigo Exam c/w peripheral vertigo likely inner ear related, for meclizine prn  Followup: No follow-ups on file.  [name], MD 05/03/2021 4:23 PM [facility]

## 2021-04-29 NOTE — Progress Notes (Signed)
Patient consent obtained. Irrigation with water and peroxide performed on right ear. Full view of tympanic membranes after procedure.  Patient tolerated procedure well.

## 2021-05-01 ENCOUNTER — Other Ambulatory Visit: Payer: Self-pay | Admitting: Emergency Medicine

## 2021-05-01 DIAGNOSIS — I10 Essential (primary) hypertension: Secondary | ICD-10-CM

## 2021-05-03 ENCOUNTER — Encounter: Payer: Self-pay | Admitting: Internal Medicine

## 2021-05-03 NOTE — Assessment & Plan Note (Signed)
Mild to mod, for flonase restart,  to f/u any worsening symptoms or concerns 

## 2021-05-03 NOTE — Assessment & Plan Note (Signed)
right wax impaction resolved with irrigation and hearing improved  Ceruminosis is noted.  Wax is removed by syringing and manual debridement. Instructions for home care to prevent wax buildup are given.

## 2021-05-03 NOTE — Assessment & Plan Note (Signed)
Mild to mod, for antibx course,  to f/u any worsening symptoms or concerns 

## 2021-05-03 NOTE — Assessment & Plan Note (Signed)
Exam c/w peripheral vertigo likely inner ear related, for meclizine prn

## 2021-05-07 ENCOUNTER — Other Ambulatory Visit: Payer: Self-pay

## 2021-05-07 ENCOUNTER — Encounter: Payer: Self-pay | Admitting: Emergency Medicine

## 2021-05-07 ENCOUNTER — Ambulatory Visit: Payer: BC Managed Care – PPO | Admitting: Emergency Medicine

## 2021-05-07 VITALS — BP 116/60 | HR 97 | Ht 69.0 in | Wt 182.0 lb

## 2021-05-07 DIAGNOSIS — H6122 Impacted cerumen, left ear: Secondary | ICD-10-CM | POA: Diagnosis not present

## 2021-05-07 NOTE — Progress Notes (Signed)
Daniel Lucero 71 y.o.   Chief Complaint  Patient presents with   Cerumen Impaction    Left ear    HISTORY OF PRESENT ILLNESS: This is a 71 y.o. male complaining of left ear cerumen impaction.  HPI   Prior to Admission medications   Medication Sig Start Date End Date Taking? Authorizing Provider  aspirin 325 MG EC tablet Take 325 mg by mouth daily.    [provider]  doxycycline (VIBRA-TABS) 100 MG tablet Take 1 tablet (100 mg total) by mouth 2 (two) times daily. 04/29/21   Biagio Borg, MD  fluticasone (FLONASE) 50 MCG/ACT nasal spray Place 2 sprays into both nostrils daily. 04/29/21   Biagio Borg, MD  losartan (COZAAR) 50 MG tablet Take 1 tablet (50 mg total) by mouth daily. 05/01/21   Horald Pollen, MD  meclizine (ANTIVERT) 12.5 MG tablet Take 1 tablet (12.5 mg total) by mouth 3 (three) times daily as needed for dizziness. 04/29/21 04/29/22  Biagio Borg, MD  tamsulosin (FLOMAX) 0.4 MG CAPS capsule Take 1 capsule by mouth daily. 05/21/15   [provider]    Allergies  Allergen Reactions   Penicillins Rash and Other (See Comments)    Has patient had a PCN reaction causing immediate rash, facial/tongue/throat swelling, SOB or lightheadedness with hypotension: Yes Has patient had a PCN reaction causing severe rash involving mucus membranes or skin necrosis: No Has patient had a PCN reaction that required hospitalization: No Has patient had a PCN reaction occurring within the last 10 years: No If all of the above answers are "NO", then may proceed with Cephalosporin use.    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   Benign enlargement of prostate 05/21/2015   Bipolar II disorder ([place]) 05/21/2015   Cardiac dysrhythmia 05/21/2015   Diverticulosis 05/21/2015   Elevated PSA 05/21/2015   History of tobacco use 05/21/2015   Recurrent major depressive disorder ([place]) 05/21/2015    Past Medical History:  Diagnosis Date   Depression    History of kidney stones    Squamous cell carcinoma of skin 05/2020   well diff scc right forehead tx with bx    Past Surgical History:  Procedure Laterality Date   HERNIA REPAIR     JOINT REPLACEMENT N/A    Phreesia 08/20/2019   TONSILLECTOMY     TOTAL SHOULDER ARTHROPLASTY Left 02/06/2019   Procedure: TOTAL SHOULDER ARTHROPLASTY;  Surgeon: Nicholes Stairs, MD;  Location: [place];  Service: Orthopedics;  Laterality: Left;  2.5 hrs    Social History   Socioeconomic History   Marital status: Married    Spouse name: Not on file   Number of children: Not on file   Years of education: Not on file   Highest education level: Not on file  Occupational History   Not on file  Tobacco Use   Smoking status: Some Days  Types: Cigars   Smokeless tobacco: Never   Tobacco comments:    Former cigarettes, quit 2007. cigars 1-3 times/week  Vaping Use   Vaping Use: Never used  Substance and Sexual Activity   Alcohol use: Yes    Alcohol/week: 5.0 standard drinks    Types: 5 Cans of beer per week   Drug use: Never   Sexual activity: Not on file  Other Topics Concern   Not on file  Social History Narrative   Not on file   Social Determinants of Health   Financial Resource Strain: Low Risk    Difficulty of Paying Living Expenses: Not hard at all  Food Insecurity: No Food Insecurity   Worried About [employer] in the Last Year: Never true   [place] in the Last Year: Never true  Transportation Needs: No Transportation Needs   Lack of Transportation (Medical): No   Lack of Transportation (Non-Medical): No  Physical Activity: Insufficiently Active   Days of Exercise per Week: 2 days   Minutes of Exercise per Session: 30 min  Stress: Not on file  Social Connections: Not on file  Intimate Partner Violence: Not on file    Family History  Problem Relation Age of Onset   Alzheimer's disease Mother    Hypertension Father    Heart disease Father      Review of Systems  Constitutional: Negative.  Negative for chills and fever.  HENT:  Positive for hearing loss. Negative for congestion and sore throat.   Respiratory: Negative.  Negative for cough and shortness of breath.   Cardiovascular:  Negative for chest pain and palpitations.  Gastrointestinal: Negative.  Negative for abdominal pain, nausea and vomiting.  Genitourinary: Negative.   Skin: Negative.  Negative for rash.  Neurological:  Negative for dizziness and headaches.  All other systems reviewed and are negative.  Today's Vitals   05/07/21 1554  BP: 116/60  Pulse: 97  SpO2: 96%  Weight: 182 lb (82.6 kg)  Height: 5\' 9"  (1.753 m)   Body mass index is 26.88 kg/m.  Physical Exam Vitals reviewed.  Constitutional:      Appearance: Normal appearance.  HENT:     Head: Normocephalic.     Left Ear: There is impacted cerumen.  Eyes:     Extraocular Movements: Extraocular movements intact.  Cardiovascular:     Rate and Rhythm: Normal rate.  Pulmonary:     Effort: Pulmonary effort is normal.  Skin:    General: Skin is warm and dry.  Neurological:     Mental Status: He is alert and oriented to person, place, and time.  Psychiatric:        Mood and Affect: Mood normal.        Behavior: Behavior normal.    Ceruminosis is noted.  Wax is removed by syringing and manual debridement. Instructions for home care to  prevent wax buildup are given.  Post procedure exam shows intact tympanic membrane and complete resolution of impaction.  ASSESSMENT & PLAN: Problem List Items Addressed This Visit   None Visit Diagnoses     Hearing loss of left ear due to cerumen impaction    -  Primary   Relevant Orders   Ear Lavage      Patient Instructions  Earwax Buildup, Adult The ears produce a substance called earwax that helps keep bacteria out of the ear and protects the skin in the ear canal. Occasionally, earwax can build up in the  ear and cause discomfort or hearing loss. What are the causes? This condition is caused by a buildup of earwax. Ear canals are self-cleaning. Ear wax is made in the outer part of the ear canal and generally falls out in small amounts over time. When the self-cleaning mechanism is not working, earwax builds up and can cause decreased hearing and discomfort. Attempting to clean ears with cotton swabs can push the earwax deep into the ear canal and cause decreased hearing and pain. What increases the risk? This condition is more likely to develop in people who: Clean their ears often with cotton swabs. Pick at their ears. Use earplugs or in-ear headphones often, or wear hearing aids. The following factors may also make you more likely to develop this condition: Being male. Being of older age. Naturally producing more earwax. Having narrow ear canals. Having earwax that is overly thick or sticky. Having excess hair in the ear canal. Having eczema. Being dehydrated. What are the signs or symptoms? Symptoms of this condition include: Reduced or muffled hearing. A feeling of fullness in the ear or feeling that the ear is plugged. Fluid coming from the ear. Ear pain or an itchy ear. Ringing in the ear. Coughing. Balance problems. An obvious piece of earwax that can be seen inside the ear canal. How is this diagnosed? This condition may be diagnosed based on: Your  symptoms. Your medical history. An ear exam. During the exam, your health care provider will look into your ear with an instrument called an otoscope. You may have tests, including a hearing test. How is this treated? This condition may be treated by: Using ear drops to soften the earwax. Having the earwax removed by a health care provider. The health care provider may: Flush the ear with water. Use an instrument that has a loop on the end (curette). Use a suction device. Having surgery to remove the wax buildup. This may be done in severe cases. Follow these instructions at home:  Take over-the-counter and prescription medicines only as told by your health care provider. Do not put any objects, including cotton swabs, into your ear. You can clean the opening of your ear canal with a washcloth or facial tissue. Follow instructions from your health care provider about cleaning your ears. Do not overclean your ears. Drink enough fluid to keep your urine pale yellow. This will help to thin the earwax. Keep all follow-up visits as told. If earwax builds up in your ears often or if you use hearing aids, consider seeing your health care provider for routine, preventive ear cleanings. Ask your health care provider how often you should schedule your cleanings. If you have hearing aids, clean them according to instructions from the manufacturer and your health care provider. Contact a health care provider if: You have ear pain. You develop a fever. You have pus or other fluid coming from your ear. You have hearing loss. You have ringing in your ears that does not go away. You feel like the room is spinning (vertigo). Your symptoms do not improve with treatment. Get help right away if: You have bleeding from the affected ear. You have severe ear pain. Summary Earwax can build up in the ear and cause discomfort or hearing loss. The most common symptoms of this condition include reduced or  muffled hearing, a feeling of fullness in the ear, or feeling that the ear is plugged. This condition may be diagnosed based on your symptoms, your medical history, and an  ear exam. This condition may be treated by using ear drops to soften the earwax or by having the earwax removed by a health care provider. Do not put any objects, including cotton swabs, into your ear. You can clean the opening of your ear canal with a washcloth or facial tissue. This information is not intended to replace advice given to you by your health care provider. Make sure you discuss any questions you have with your health care provider. Document Revised: 06/19/2019 Document Reviewed: 06/19/2019 Elsevier Patient Education  2022 [place], [place] [facility]

## 2021-05-07 NOTE — Patient Instructions (Signed)
Earwax Buildup, Adult ?The ears produce a substance called earwax that helps keep bacteria out of the ear and protects the skin in the ear canal. Occasionally, earwax can build up in the ear and cause discomfort or hearing loss. ?What are the causes? ?This condition is caused by a buildup of earwax. Ear canals are self-cleaning. Ear wax is made in the outer part of the ear canal and generally falls out in small amounts over time. ?When the self-cleaning mechanism is not working, earwax builds up and can cause decreased hearing and discomfort. Attempting to clean ears with cotton swabs can push the earwax deep into the ear canal and cause decreased hearing and pain. ?What increases the risk? ?This condition is more likely to develop in people who: ?Clean their ears often with cotton swabs. ?Pick at their ears. ?Use earplugs or in-ear headphones often, or wear hearing aids. ?The following factors may also make you more likely to develop this condition: ?Being male. ?Being of older age. ?Naturally producing more earwax. ?Having narrow ear canals. ?Having earwax that is overly thick or sticky. ?Having excess hair in the ear canal. ?Having eczema. ?Being dehydrated. ?What are the signs or symptoms? ?Symptoms of this condition include: ?Reduced or muffled hearing. ?A feeling of fullness in the ear or feeling that the ear is plugged. ?Fluid coming from the ear. ?Ear pain or an itchy ear. ?Ringing in the ear. ?Coughing. ?Balance problems. ?An obvious piece of earwax that can be seen inside the ear canal. ?How is this diagnosed? ?This condition may be diagnosed based on: ?Your symptoms. ?Your medical history. ?An ear exam. During the exam, your health care provider will look into your ear with an instrument called an otoscope. ?You may have tests, including a hearing test. ?How is this treated? ?This condition may be treated by: ?Using ear drops to soften the earwax. ?Having the earwax removed by a health care provider. The  health care provider may: ?Flush the ear with water. ?Use an instrument that has a loop on the end (curette). ?Use a suction device. ?Having surgery to remove the wax buildup. This may be done in severe cases. ?Follow these instructions at home: ? ?Take over-the-counter and prescription medicines only as told by your health care provider. ?Do not put any objects, including cotton swabs, into your ear. You can clean the opening of your ear canal with a washcloth or facial tissue. ?Follow instructions from your health care provider about cleaning your ears. Do not overclean your ears. ?Drink enough fluid to keep your urine pale yellow. This will help to thin the earwax. ?Keep all follow-up visits as told. If earwax builds up in your ears often or if you use hearing aids, consider seeing your health care provider for routine, preventive ear cleanings. Ask your health care provider how often you should schedule your cleanings. ?If you have hearing aids, clean them according to instructions from the manufacturer and your health care provider. ?Contact a health care provider if: ?You have ear pain. ?You develop a fever. ?You have pus or other fluid coming from your ear. ?You have hearing loss. ?You have ringing in your ears that does not go away. ?You feel like the room is spinning (vertigo). ?Your symptoms do not improve with treatment. ?Get help right away if: ?You have bleeding from the affected ear. ?You have severe ear pain. ?Summary ?Earwax can build up in the ear and cause discomfort or hearing loss. ?The most common symptoms of this condition include   reduced or muffled hearing, a feeling of fullness in the ear, or feeling that the ear is plugged. ?This condition may be diagnosed based on your symptoms, your medical history, and an ear exam. ?This condition may be treated by using ear drops to soften the earwax or by having the earwax removed by a health care provider. ?Do not put any objects, including cotton  swabs, into your ear. You can clean the opening of your ear canal with a washcloth or facial tissue. ?This information is not intended to replace advice given to you by your health care provider. Make sure you discuss any questions you have with your health care provider. ?Document Revised: 06/19/2019 Document Reviewed: 06/19/2019 ?Elsevier Patient Education ? 2022 Elsevier Inc[employer] ? ?

## 2021-07-08 ENCOUNTER — Encounter: Payer: Self-pay | Admitting: Emergency Medicine

## 2021-07-08 ENCOUNTER — Ambulatory Visit: Payer: BC Managed Care – PPO | Admitting: Emergency Medicine

## 2021-07-08 VITALS — BP 104/82 | HR 53 | Temp 97.9°F | Ht 69.0 in | Wt 183.1 lb

## 2021-07-08 DIAGNOSIS — M549 Dorsalgia, unspecified: Secondary | ICD-10-CM | POA: Insufficient documentation

## 2021-07-08 DIAGNOSIS — I10 Essential (primary) hypertension: Secondary | ICD-10-CM | POA: Diagnosis not present

## 2021-07-08 DIAGNOSIS — R42 Dizziness and giddiness: Secondary | ICD-10-CM | POA: Diagnosis not present

## 2021-07-08 MED ORDER — CYCLOBENZAPRINE HCL 10 MG PO TABS: 10.0000 mg | ORAL_TABLET | Freq: Every day | ORAL | 0 refills | Status: DC

## 2021-07-08 NOTE — Assessment & Plan Note (Signed)
Well-controlled hypertension.  Continue losartan 50 mg daily. ?BP Readings from Last 3 Encounters:  ?07/08/21 104/82  ?05/07/21 116/60  ?04/29/21 110/74  ? ? ?

## 2021-07-08 NOTE — Assessment & Plan Note (Addendum)
Most likely secondary to inner ear problem.  Continue meclizine. ?Follow-up with ENT for further evaluation. ?No red flag signs or symptoms.  No signs of a stroke. ?Differential diagnosis of vertigo discussed with patient. ?Most recent lab results reviewed with patient.  Normal. ?

## 2021-07-08 NOTE — Progress Notes (Signed)
Daniel Lucero ?71 y.o. ? ? ?Chief Complaint  ?Patient presents with  ? Dizziness  ?  No getting better  ? Back Pain  ?  Lower back pain x few weeks. Left side pain near his left kidney. Hx of kidney stone  ? ? ?HISTORY OF PRESENT ILLNESS: ?This is a 71 y.o. male complaining of vertigo and dizziness for several weeks. ?Meclizine helping some. ?Also complaining of left lower back pain for 2 weeks.  Constant sharp pain with no radiation and worse with movement.  Not associated with any other symptoms. ?No other complaints or medical concerns today. ? ?HPI ? ? ?Prior to Admission medications   ?Medication Sig Start Date End Date Taking? Authorizing Provider  ?aspirin 325 MG EC tablet Take 325 mg by mouth daily.    [provider]  ?doxycycline (VIBRA-TABS) 100 MG tablet Take 1 tablet (100 mg total) by mouth 2 (two) times daily. 04/29/21   Biagio Borg, MD  ?fluticasone Asencion Islam) 50 MCG/ACT nasal spray Place 2 sprays into both nostrils daily. 04/29/21   Biagio Borg, MD  ?losartan (COZAAR) 50 MG tablet Take 1 tablet (50 mg total) by mouth daily. 05/01/21   Horald Pollen, MD  ?meclizine (ANTIVERT) 12.5 MG tablet Take 1 tablet (12.5 mg total) by mouth 3 (three) times daily as needed for dizziness. 04/29/21 04/29/22  Biagio Borg, MD  ?tamsulosin (FLOMAX) 0.4 MG CAPS capsule Take 1 capsule by mouth daily. 05/21/15   [provider]  ? ? ?Allergies  ?Allergen Reactions  ? Penicillins Rash and Other (See Comments)  ?  Has patient had a PCN reaction causing immediate rash, facial/tongue/throat swelling, SOB or lightheadedness with hypotension: Yes ?Has patient had a PCN reaction causing severe rash involving mucus membranes or skin necrosis: No ?Has patient had a PCN reaction that required hospitalization: No ?Has patient had a PCN reaction occurring within the last 10 years: No ?If all of the above answers are "NO", then may proceed with Cephalosporin use.  ? ? ?Patient Active Problem List  ?  Diagnosis Date Noted  ? Allergic rhinitis 04/29/2021  ? Upper respiratory infection 04/29/2021  ? Acute hearing loss, right 04/29/2021  ? Vertigo 04/29/2021  ? Essential hypertension 11/03/2020  ? 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 DDD (degenerative disc disease), cervical 01/12/2018  ? Basal cell carcinoma (BCC) of left side of neck 05/05/2017  ? Chronic left shoulder pain 05/05/2017  ? 10 year risk of MI or stroke 7.5% or greater 12/15/2016  ? Primary osteoarthritis involving multiple joints 12/15/2016  ? Affective personality disorder 05/21/2015  ? Benign enlargement of prostate 05/21/2015  ? Bipolar II disorder ([place]) 05/21/2015  ? Cardiac dysrhythmia 05/21/2015  ? Diverticulosis 05/21/2015  ? Elevated PSA 05/21/2015  ? History of tobacco use 05/21/2015  ? Recurrent major depressive disorder ([place]) 05/21/2015  ? ? ?Past Medical History:  ?Diagnosis Date  ? Depression   ? History of kidney stones   ? 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 ? ?Past Surgical History:  ?Procedure Laterality Date  ? HERNIA REPAIR    ? JOINT REPLACEMENT N/A   ? [employer] 08/20/2019  ? TONSILLECTOMY    ? TOTAL SHOULDER ARTHROPLASTY Left 02/06/2019  ? Procedure: TOTAL SHOULDER ARTHROPLASTY;  Surgeon: Nicholes Stairs, MD;  Location: [facility];  Service: Orthopedics;  Laterality: Left;  2.5 hrs  ? ? ?  Social History  ? ?Socioeconomic History  ? Marital status: Married  ?  Spouse name: Not on file  ? Number of children: Not on file  ? Years of education: Not on file  ? Highest education level: Not on file  ?Occupational History  ? Not on file  ?Tobacco Use  ? Smoking status: Some Days  ?  Types: Cigars  ? Smokeless tobacco: Never  ? Tobacco comments:  ?  Former cigarettes, quit 2007. cigars 1-3 times/week  ?Vaping Use  ? Vaping Use: Never used  ?Substance and Sexual Activity  ? Alcohol use: Yes  ?  Alcohol/week: 5.0 standard drinks  ?   Types: 5 Cans of beer per week  ? Drug use: Never  ? Sexual activity: Not on file  ?Other Topics Concern  ? Not on file  ?Social History Narrative  ? Not on file  ? ?Social Determinants of Health  ? ?Financial Resource Strain: Low Risk   ? Difficulty of Paying Living Expenses: Not hard at all  ?Food Insecurity: No Food Insecurity  ? Worried About Charity fundraiser in the Last Year: Never true  ? Ran Out of Food in the Last Year: Never true  ?Transportation Needs: No Transportation Needs  ? Lack of Transportation (Medical): No  ? Lack of Transportation (Non-Medical): No  ?Physical Activity: Insufficiently Active  ? Days of Exercise per Week: 2 days  ? Minutes of Exercise per Session: 30 min  ?Stress: Not on file  ?Social Connections: Not on file  ?Intimate Partner Violence: Not on file  ? ? ?Family History  ?Problem Relation Age of Onset  ? Alzheimer's disease Mother   ? Hypertension Father   ? Heart disease Father   ? ? ? ?Review of Systems  ?Constitutional: Negative.  Negative for chills and fever.  ?HENT:  Positive for tinnitus. Negative for congestion and sore throat.   ?Eyes: Negative.   ?Respiratory:  Negative for cough and shortness of breath.   ?Cardiovascular: Negative.  Negative for chest pain and palpitations.  ?Gastrointestinal:  Negative for abdominal pain, diarrhea, nausea and vomiting.  ?Genitourinary: Negative.   ?Musculoskeletal:  Positive for back pain.  ?Skin: Negative.  Negative for rash.  ?Neurological:  Positive for dizziness. Negative for headaches.  ?All other systems reviewed and are negative. ? ?Today's Vitals  ? 07/08/21 1336  ?BP: 104/82  ?Pulse: (!) 53  ?Temp: 97.9 ?F (36.6 ?C)  ?TempSrc: Oral  ?SpO2: 97%  ?Weight: 183 lb 2 oz (83.1 kg)  ?Height: '5\' 9"'$  (1.753 m)  ? ?Body mass index is 27.04 kg/m?. ? ?Physical Exam ?Vitals reviewed.  ?Constitutional:   ?   Appearance: Normal appearance.  ?HENT:  ?   Head: Normocephalic.  ?   Right Ear: Tympanic membrane, ear canal and external ear  normal.  ?   Left Ear: Tympanic membrane, ear canal and external ear normal.  ?   Mouth/Throat:  ?   Mouth: Mucous membranes are moist.  ?   Pharynx: Oropharynx is clear.  ?Eyes:  ?   Extraocular Movements: Extraocular movements intact.  ?   Conjunctiva/sclera: Conjunctivae normal.  ?   Pupils: Pupils are equal, round, and reactive to light.  ?Cardiovascular:  ?   Rate and Rhythm: Normal rate and regular rhythm.  ?   Pulses: Normal pulses.  ?   Heart sounds: Normal heart sounds.  ?Pulmonary:  ?   Effort: Pulmonary effort is normal.  ?   Breath sounds: Normal breath sounds.  ?  Abdominal:  ?   General: There is no distension.  ?   Palpations: Abdomen is soft.  ?   Tenderness: There is no abdominal tenderness.  ?Musculoskeletal:  ?   Cervical back: No tenderness.  ?Lymphadenopathy:  ?   Cervical: No cervical adenopathy.  ?Skin: ?   General: Skin is warm and dry.  ?   Capillary Refill: Capillary refill takes less than 2 seconds.  ?Neurological:  ?   General: No focal deficit present.  ?   Mental Status: He is alert and oriented to person, place, and time.  ?   Cranial Nerves: No cranial nerve deficit.  ?   Sensory: No sensory deficit.  ?   Motor: No weakness.  ?   Gait: Gait normal.  ?Psychiatric:     ?   Mood and Affect: Mood normal.     ?   Behavior: Behavior normal.  ? ? ? ?ASSESSMENT & PLAN: ?Problem List Items Addressed This Visit   ? ?  ? Cardiovascular and Mediastinum  ? Essential hypertension  ?  Well-controlled hypertension.  Continue losartan 50 mg daily. ?BP Readings from Last 3 Encounters:  ?07/08/21 104/82  ?05/07/21 116/60  ?04/29/21 110/74  ? ? ?  ?  ?  ? Other  ? Vertigo - Primary  ?  Most likely secondary to inner ear problem.  Continue meclizine. ?Follow-up with ENT for further evaluation. ?No red flag signs or symptoms.  No signs of a stroke. ?Differential diagnosis of vertigo discussed with patient. ?Most recent lab results reviewed with patient.  Normal. ? ?  ?  ? Relevant Orders  ? Ambulatory  referral to ENT  ? Musculoskeletal back pain  ?  Mechanical pain.  Clinical picture not compatible with kidney stone. ?May benefit from Flexeril at bedtime. ? ?  ?  ? Relevant Medications  ? cyclobenzaprine (FLEXERIL) 10 M

## 2021-07-08 NOTE — Patient Instructions (Signed)
Vertigo Vertigo is the feeling that you or the things around you are moving when they are not. This feeling can come and go at any time. Vertigo often goes away on its own. This condition can be dangerous if it happens when you are doing activities like driving or working with machines. Your doctor will do tests to find the cause of your vertigo. These tests will also help your doctor decide on the best treatment for you. Follow these instructions at home: Eating and drinking     Drink enough fluid to keep your pee (urine) pale yellow. Do not drink alcohol. Activity Return to your normal activities when your doctor says that it is safe. In the morning, first sit up on the side of the bed. When you feel okay, stand slowly while you hold onto something until you know that your balance is fine. Move slowly. Avoid sudden body or head movements or certain positions, as told by your doctor. Use a cane if you have trouble standing or walking. Sit down right away if you feel dizzy. Avoid doing any tasks or activities that can cause danger to you or others if you get dizzy. Avoid bending down if you feel dizzy. Place items in your home so that they are easy for you to reach without bending or leaning over. Do not drive or use machinery if you feel dizzy. General instructions Take over-the-counter and prescription medicines only as told by your doctor. Keep all follow-up visits. Contact a doctor if: Your medicine does not help your vertigo. Your problems get worse or you have new symptoms. You have a fever. You feel like you may vomit (nauseous), or this feeling gets worse. You start to vomit. Your family or friends see changes in how you act. You lose feeling (have numbness) in part of your body. You feel prickling and tingling in a part of your body. Get help right away if: You are always dizzy. You faint. You get very bad headaches. You get a stiff neck. Bright light starts to bother  you. You have trouble moving or talking. You feel weak in your hands, arms, or legs. You have changes in your hearing or in how you see (vision). These symptoms may be an emergency. Get help right away. Call your local emergency services (911 in the [place] Do not wait to see if the symptoms will go away. Do not drive yourself to the hospital. Summary Vertigo is the feeling that you or the things around you are moving when they are not. Your doctor will do tests to find the cause of your vertigo. You may be told to avoid some tasks, positions, or movements. Contact a doctor if your medicine is not helping, or if you have a fever, new symptoms, or a change in how you act. Get help right away if you get very bad headaches, or if you have changes in how you speak, hear, or see. This information is not intended to replace advice given to you by your health care provider. Make sure you discuss any questions you have with your health care provider. Document Revised: 01/30/2020 Document Reviewed: 01/30/2020 Elsevier Patient Education  2023 Elsevier Inc.  

## 2021-07-08 NOTE — Assessment & Plan Note (Signed)
Mechanical pain.  Clinical picture not compatible with kidney stone. ?May benefit from Flexeril at bedtime. ?

## 2021-09-11 ENCOUNTER — Other Ambulatory Visit: Payer: Self-pay | Admitting: Internal Medicine

## 2021-11-09 ENCOUNTER — Other Ambulatory Visit: Payer: Self-pay | Admitting: Otolaryngology

## 2021-11-09 DIAGNOSIS — R42 Dizziness and giddiness: Secondary | ICD-10-CM

## 2021-11-10 ENCOUNTER — Telehealth (INDEPENDENT_AMBULATORY_CARE_PROVIDER_SITE_OTHER): Payer: BC Managed Care – PPO | Admitting: Family Medicine

## 2021-11-10 DIAGNOSIS — R0981 Nasal congestion: Secondary | ICD-10-CM

## 2021-11-10 DIAGNOSIS — R059 Cough, unspecified: Secondary | ICD-10-CM | POA: Diagnosis not present

## 2021-11-10 MED ORDER — BENZONATATE 100 MG PO CAPS: ORAL_CAPSULE | ORAL | 0 refills | Status: DC

## 2021-11-10 NOTE — Progress Notes (Signed)
Virtual Visit via Telephone Note  I connected with Daniel Lucero on 11/10/21 at 11:20 AM EDT by telephone and verified that I am speaking with the correct person using two identifiers.   I discussed the limitations of performing an evaluation and management service by telephone and requested permission for a phone visit. The patient expressed understanding and agreed to proceed.  Location patient:  [place] Location provider: work or home office Participants present for the call: patient, provider Patient did not have a visit with me in the prior 7 days to address this/these issue(s).   History of Present Illness:  Acute telemedicine visit for sinus issues, cough: -Onset: 5 days ago -Symptoms include: nasal congestion, pnd, sore throat, cough, some sneezing - wonders if allergies -home covid test was negative on day 2 -Denies: CP, SOB, NVD, fever, body aches -Pertinent past medical history: see below -using flonase twice daily -Pertinent medication allergies:  Allergies  Allergen Reactions   Penicillins Rash and Other (See Comments)    Has patient had a PCN reaction causing immediate rash, facial/tongue/throat swelling, SOB or lightheadedness with hypotension: Yes Has patient had a PCN reaction causing severe rash involving mucus membranes or skin necrosis: No Has patient had a PCN reaction that required hospitalization: No Has patient had a PCN reaction occurring within the last 10 years: No If all of the above answers are "NO", then may proceed with Cephalosporin use.  -COVID-19 vaccine status: Immunization History  Administered Date(s) Administered   Fluad Quad(high Dose 65+) 12/04/2018, 01/04/2020, 12/19/2020   Influenza, High Dose Seasonal PF 11/29/2016, 12/16/2017   Influenza,inj,Quad PF,6+ Mos 12/17/2014, 12/18/2015   Influenza,inj,quad, With Preservative 12/16/2017   PFIZER Comirnaty(Gray Top)Covid-19 Tri-Sucrose Vaccine 06/20/2020   PFIZER(Purple Top)SARS-COV-2  Vaccination 04/06/2019, 04/27/2019, 12/11/2019, 11/22/2020   Pneumococcal Conjugate-13 12/15/2016   Td 12/28/2019   Tdap 12/22/2007, 08/05/2016   Zoster, Live 09/09/2015      Past Medical History:  Diagnosis Date   Depression    History of kidney stones    Squamous cell carcinoma of skin 05/2020   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 90 tablet 3   tamsulosin (FLOMAX) 0.4 MG CAPS capsule Take 1 capsule by mouth daily.     aspirin 325 MG EC tablet Take 325 mg by mouth daily. (Patient not taking: Reported on 11/10/2021)     cyclobenzaprine (FLEXERIL) 10 MG tablet Take 1 tablet (10 mg total) by mouth at bedtime. (Patient not taking: Reported on 11/10/2021) 30 tablet 0   doxycycline (VIBRA-TABS) 100 MG tablet Take 1 tablet (100 mg total) by mouth 2 (two) times daily. (Patient not taking: Reported on 11/10/2021) 20 tablet 0   fluticasone (FLONASE) 50 MCG/ACT nasal spray Place 2 sprays into both nostrils daily. (Patient not taking: Reported on 11/10/2021) 16 g 6   Meclizine HCl 25 MG CHEW TAKE 1/2 TABLET BY MOUTH THREE TIMES DAILY AS NEEDED FOR DIZZINESS (Patient not taking: Reported on 11/10/2021) 45 tablet 2   No current facility-administered medications on file prior to visit.    Observations/Objective: Patient sounds cheerful and well on the phone. I do not appreciate any SOB. Speech and thought processing are grossly intact. Patient reported vitals:  Assessment and Plan:  Cough, unspecified type  Nasal congestion  -we discussed possible serious and likely etiologies, options for  evaluation and workup, limitations of telemedicine visit vs in person visit, treatment, treatment risks and precautions. Pt prefers to treat via telemedicine empirically rather than in person at this moment. Query Allergic rhinosinusitis,  VURI vs other. Have seen a lot of covid cases the last week or so, so also could be covid with false negative early testing. Discussed covid testing. Trial Allegra once daily along with flonase and Tessalon rx for cough. Nasal saline sinus rinse.   Advised to seek prompt virtual visit or in person care if worsening, new symptoms arise, or if is not improving with treatment as expected per our conversation of expected course. Discussed options for follow up care. Did let this patient know that I do telemedicine on Tuesdays and Thursdays for [facility] and those are the days I am logged into the system. Advised to schedule follow up visit with PCP, [facility] virtual visits or [facility] if any further questions or concerns to avoid delays in care.   I discussed the assessment and treatment plan with the patient. The patient was provided an opportunity to ask questions and all were answered. The patient agreed with the plan and demonstrated an understanding of the instructions.    Follow Up Instructions:  I did not refer this patient for an OV with me in the next 24 hours for this/these issue(s).  I discussed the assessment and treatment plan with the patient. The patient was provided an opportunity to ask questions and all were answered. The patient agreed with the plan and demonstrated an understanding of the instructions.   I spent 12 minutes on the date of this visit in the care of this patient. See summary of tasks completed to properly care for this patient in the detailed notes above which also included counseling of above, review of PMH, medications, allergies, evaluation of the patient and ordering and/or  instructing patient on testing and care options.     Lucretia Kern, DO

## 2021-11-10 NOTE — Patient Instructions (Signed)
-  I sent the medication(s) we discussed to your pharmacy: Meds ordered this encounter  Medications   benzonatate (TESSALON PERLES) 100 MG capsule    Sig: 1-2 capsules up to twice daily as needed for cough    Dispense:  30 capsule    Refill:  0   Allegra once daily for 2 weeks.  Nasal saline sinus rinse twice daily.   I hope you are feeling better soon!  Seek in person care promptly if your symptoms worsen, new concerns arise or you are not improving with treatment.  It was nice to meet you today. I help [facility] out with telemedicine visits on Tuesdays and Thursdays and am happy to help if you need a virtual follow up visit on those days. Otherwise, if you have any concerns or questions following this visit please schedule a follow up visit with your Primary Care office or seek care at a local urgent care clinic to avoid delays in care. If you are having severe or life threatening symptoms please call 911 and/or go to the nearest emergency room.

## 2021-11-12 ENCOUNTER — Other Ambulatory Visit: Payer: Self-pay | Admitting: Family Medicine

## 2021-11-27 ENCOUNTER — Ambulatory Visit
Admission: RE | Admit: 2021-11-27 | Discharge: 2021-11-27 | Disposition: A | Discharge status: Home / Self Care | Payer: BC Managed Care – PPO | Source: Ambulatory Visit | Attending: Otolaryngology | Admitting: Otolaryngology

## 2021-11-27 DIAGNOSIS — R42 Dizziness and giddiness: Secondary | ICD-10-CM

## 2021-11-27 MED ORDER — GADOBENATE DIMEGLUMINE 529 MG/ML IV SOLN
17.0000 mL | Freq: Once | INTRAVENOUS | Status: AC | PRN
Start: 2021-11-27 — End: 2021-11-27
  Administered 2021-11-27: 17 mL via INTRAVENOUS

## 2021-12-08 ENCOUNTER — Ambulatory Visit: Payer: BC Managed Care – PPO | Admitting: Dermatology

## 2022-04-27 ENCOUNTER — Other Ambulatory Visit: Payer: Self-pay | Admitting: Emergency Medicine

## 2022-04-27 DIAGNOSIS — I10 Essential (primary) hypertension: Secondary | ICD-10-CM

## 2022-11-02 ENCOUNTER — Ambulatory Visit (INDEPENDENT_AMBULATORY_CARE_PROVIDER_SITE_OTHER): Payer: BC Managed Care – PPO | Admitting: Cardiology

## 2022-11-02 ENCOUNTER — Encounter (HOSPITAL_BASED_OUTPATIENT_CLINIC_OR_DEPARTMENT_OTHER): Payer: Self-pay | Admitting: Cardiology

## 2022-11-02 VITALS — BP 134/76 | HR 46 | Ht 69.0 in | Wt 180.9 lb

## 2022-11-02 DIAGNOSIS — Z7189 Other specified counseling: Secondary | ICD-10-CM

## 2022-11-02 DIAGNOSIS — R079 Chest pain, unspecified: Secondary | ICD-10-CM

## 2022-11-02 DIAGNOSIS — R002 Palpitations: Secondary | ICD-10-CM

## 2022-11-02 DIAGNOSIS — R072 Precordial pain: Secondary | ICD-10-CM

## 2022-11-02 DIAGNOSIS — R0789 Other chest pain: Secondary | ICD-10-CM

## 2022-11-02 DIAGNOSIS — I10 Essential (primary) hypertension: Secondary | ICD-10-CM | POA: Diagnosis not present

## 2022-11-02 NOTE — Patient Instructions (Addendum)
Medication Instructions:  Your physician recommends that you continue on your current medications as directed. Please refer to the Current Medication list given to you today.  *If you need a refill on your cardiac medications before your next appointment, please call your pharmacy*  Lab Work: BMET 1 WEEK PRIOR TO CT   If you have labs (blood work) drawn today and your tests are completely normal, you will receive your results only by: MyChart Message (if you have MyChart) OR A paper copy in the mail If you have any lab test that is abnormal or we need to change your treatment, we will call you to review the results.  Testing/Procedures: Your physician has requested that you have cardiac CT. Cardiac computed tomography (CT) is a painless test that uses an x-ray machine to take clear, detailed pictures of your heart. For further information please visit [email or web address] Please follow instruction sheet as given.  Follow-Up: At [facility], you and your health needs are our priority.  As part of our continuing mission to provide you with exceptional heart care, we have created designated Provider Care Teams.  These Care Teams include your primary Cardiologist (physician) and Advanced Practice Providers (APPs -  Physician Assistants and Nurse Practitioners) who all work together to provide you with the care you need, when you need it.  We recommend signing up for the patient portal called "MyChart".  Sign up information is provided on this After Visit Summary.  MyChart is used to connect with patients for Virtual Visits (Telemedicine).  Patients are able to view lab/test results, encounter notes, upcoming appointments, etc.  Non-urgent messages can be sent to your provider as well.   To learn more about what you can do with MyChart, go to [email or web address].    Your next appointment:   6 month(s)  Provider:   Jodelle Red, MD or Gillian Shields, NP    Other  Instructions    Your cardiac CT will be scheduled at one of the below locations:   [facility] [address] [place], [place] [address] [phone]  OR  [facility] [address] [place], [place] [address] [phone]  OR   [facility] [address] [place], [place] [address] [phone]  If scheduled at [facility], please arrive at the [facility] and Children's Entrance (Entrance C2) of [facility] 30 minutes prior to test start time. You can use the FREE valet parking offered at entrance C (encouraged to control the heart rate for the test)  Proceed to the [facility] Radiology Department (first floor) to check-in and test prep.  All radiology patients and guests should use entrance C2 at [facility], accessed from [facility], even though the hospital's physical address listed is [address]    If scheduled at [facility] or [facility], please arrive 15 mins early for check-in and test prep.  There is spacious parking and easy access to the radiology department from the [facility] Heart and Vascular entrance. Please enter here and check-in with the desk attendant.   Please follow these instructions carefully (unless otherwise directed):  An IV will be required for this test and Nitroglycerin will be given.  Hold all erectile dysfunction medications at least 3 days (72 hrs) prior to test. (Ie viagra, cialis, sildenafil, tadalafil, etc)   On the Night Before the Test: Be sure to Drink plenty of water.  Do not consume any caffeinated/decaffeinated beverages or chocolate 12 hours prior to your test. Do not take any antihistamines 12 hours prior to your test.  On the Day of the Test: Drink plenty of water until 1 hour prior to the test. Do not eat any food 1 hour prior to test. You may take your  regular medications prior to the test.  Take metoprolol (Lopressor) two hours prior to test. If you take Furosemide/Hydrochlorothiazide/Spironolactone, please HOLD on the morning of the test.  After the Test: Drink plenty of water. After receiving IV contrast, you may experience a mild flushed feeling. This is normal. On occasion, you may experience a mild rash up to 24 hours after the test. This is not dangerous. If this occurs, you can take Benadryl 25 mg and increase your fluid intake. If you experience trouble breathing, this can be serious. If it is severe call 911 IMMEDIATELY. If it is mild, please call our office. If you take any of these medications: Glipizide/Metformin, Avandament, Glucavance, please do not take 48 hours after completing test unless otherwise instructed.  We will call to schedule your test 2-4 weeks out understanding that some insurance companies will need an authorization prior to the service being performed.   For more information and frequently asked questions, please visit our website : [email or web address]  For non-scheduling related questions, please contact the cardiac imaging nurse navigator should you have any questions/concerns: Cardiac Imaging Nurse Navigators Direct Office Dial: [phone]   For scheduling needs, including cancellations and rescheduling, please call [place], [phone].  Kardia for monitoring of EKGs at home: You can look into the [facility] device by [employer].This device is purchased by you and it connects to an application you download to your smart phone.  It can detect abnormal heart rhythms and alert you to contact your doctor for further evaluation. The device is approximately $90 and the phone application is free.  The web site is:  [email or web address].alivecor.com   Cardiac CT Angiogram A cardiac CT angiogram is a procedure to look at the heart and the area around the heart. It may be done to help find the cause of  chest pains or other symptoms of heart disease. During this procedure, a substance called contrast dye is injected into a vein in the arm. The contrast highlights the blood vessels in the area to be checked. A large X-ray machine (CT scanner), then takes detailed pictures of the heart and the surrounding area. The procedure is also sometimes called a coronary CT angiogram, coronary artery scanning, or CTA. A cardiac CT angiogram allows the health care provider to see how well blood is flowing to and from the heart. The provider will be able to see if there are any problems, such as: Blockage or narrowing of the arteries in the heart. Fluid around the heart. Signs of weakness or disease in the muscles, valves, and tissues of the heart. Tell a health care provider about: Any allergies you have. This is especially important if you have had a previous allergic reaction to medicines, contrast dye, or iodine. All medicines you are taking, including vitamins, herbs, eye drops, creams, and over-the-counter medicines. Any bleeding problems you have. Any surgeries you have had. Any medical conditions you have, including kidney problems or kidney failure. Whether you are pregnant or may be pregnant. Any anxiety disorders, chronic pain, or other conditions you have. These may increase your stress or prevent you from lying still. Any history of abnormal heart rhythms or heart procedures.  What are the risks? Your provider will talk with you about risks. These may include: Bleeding. Infection. Allergic reactions to medicines or dyes. Damage to other structures or organs. Kidney damage from the contrast dye. Increased risk of cancer from radiation exposure. This risk is low. Talk with your provider about: The risks and benefits of testing. How you can receive the lowest dose of radiation. What happens before the procedure? Wear comfortable clothing and remove any jewelry, glasses, dentures, and hearing  aids. Follow instructions from your provider about eating and drinking. These may include: 12 hours before the procedure Avoid caffeine. This includes tea, coffee, soda, energy drinks, and diet pills. Drink plenty of water or other fluids that do not have caffeine in them. Being well hydrated can prevent complications. 4-6 hours before the procedure Stop eating and drinking. This will reduce the risk of nausea from the contrast dye. Ask your provider about changing or stopping your regular medicines. These include: Diabetes medicines. Medicines to treat problems with erections (erectile dysfunction). If you have kidney problems, you may need to receive IV hydration before and after the test. What happens during the procedure?  Hair on your chest may need to be removed so that small sticky patches called electrodes can be placed on your chest. These will transmit information that helps to monitor your heart during the procedure. An IV will be inserted into one of your veins. You might be given a medicine to control your heart rate during the procedure. This will help to ensure that good images are obtained. You will be asked to lie on an exam table. This table will slide in and out of the CT machine during the procedure. Contrast dye will be injected into the IV. You might feel warm, or you may get a metallic taste in your mouth. You may be given medicines to relax or dilate the arteries in your heart. If you are allergic to contrast dyes or iodine you may be given medicine before the test to reduce the risk of an allergic reaction. The table that you are lying on will move into the CT machine tunnel for the scan. The person running the machine will give you instructions while the scans are being done. You may be asked to: Keep your arms above your head. Hold your breath for short periods. Stay very still, even if the table is moving. The procedure may vary among providers and  hospitals. What can I expect after the procedure? After your procedure, it is common to have: A metallic taste in your mouth from the contrast dye. A feeling of warmth. A headache from the heart medicine. Follow these instructions at home: Take over-the-counter and prescription medicines only as told by your provider. If you are told, drink enough fluid to keep your pee pale yellow. This will help to flush the contrast dye out of your body. Most people can return to their normal activities right after the procedure. Ask your provider what activities are safe for you. It is up to you to get the results of your procedure. Ask your provider, or the department that is doing the procedure, when your results will be ready. Contact a health care provider if: You have any symptoms of allergy to the contrast dye. These include: Shortness of breath. Rash or hives. A racing heartbeat. You notice a change in your peeing (urination). This information is not intended to replace advice given to you by your health care provider. Make sure you discuss  any questions you have with your health care provider. Document Revised: 10/02/2021 Document Reviewed: 10/02/2021 Elsevier Patient Education  2024 [employer].

## 2022-11-02 NOTE — Progress Notes (Signed)
Cardiology Office Note:  .    Date:  11/02/2022  ID:  Daniel Lucero, DOB 1951/03/06, MRN [id number] PCP: Gaspar Garbe, MD  [facility] Providers Cardiologist:  Jodelle Red, MD     History of Present Illness: .    Daniel Lucero is a 72 y.o. male with a hx of squamous cell carcinoma of skin, nephrolithiasis, and depression, who is seen for follow-up today. He was initially seen 04/27/2021 as a new consult at the request of [name], NP for the evaluation and management of dizziness, sinus bradycardia, and abnormal EKG.   CV history: [name] saw [name], NP on 04/07/2021 for acute dizziness, lightheadedness, and dry mouth since the day prior. He also reported LE muscle cramping L>R. EKG at that visit showed sinus bradycardia, and question of abnormal QRS complexes in a number of leads with concern for block.   Cardiovascular risk factors: Prior clinical ASCVD: None. Comorbid conditions: Hypertension - On losartan for 1.5 years, since his diagnosis. At home his blood pressure is usually elevated during his episodes of dizziness, such as 150/90. He is taking 325 mg aspirin maybe 3-4 nights a week for lightheadedness and lowering blood pressure. Metabolic syndrome/Obesity: Highest adult weight is his current weight, 187 lbs. Chronic inflammatory conditions: None Tobacco use history: Former cigarette smoker, 1/2 ppd at peak. Quit in 2007-2008. Currently smokes cigars 1-3 times a week. Family history: His father died of hypertension at 76 yo. Prior cardiac testing and/or incidental findings on other testing (ie coronary calcium): None. Exercise level: Sometimes he has intermittent shortness of breath. He took a walk yesterday, and noticed mild shortness of breath. This resolved with a short rest and he continued walking. Previously lifted weights, running. Normally he walks regularly about 3 miles at a brisk pace, 4-5 days a week, prior to his  dizziness. He was advised to slow his pace and frequency until the etiology of his dizziness was found: Lightheadedness ongoing for a while. However, he then woke up twice in the middle of the night with room-spinning dizziness. This was severe enough to prevent him from walking. He had associated diaphoresis. Usually he would sleep flat in his bed. After the dizziness developed he sleeps a little propped up which seems to help. His episodes have not been as severe as his initial episode. The latest episode occurred last night. He also notices the dizziness when bending over to tie his shoes, but this resolves spontaneously when righting himself. He denies any syncope. Current diet: "A lot." Meat, vegetables, fairly balanced. Since COVID restrictions he has been eating more sweets.   At his initial visit, we discussed that his dizziness seemed more suggestive of something like BPPV vs cardiac etiology. I recommended he reach out to his PCP to see if vestibular PT/Epley maneuvers might be helpful. Orthostatics were negative in the office. Blood pressure was at goal on losartan. Also discussed the CV risk with cigar smoking; he was not interested in quitting at that time. He was taking aspirin per personal preference. Recommended he decrease this to 81 mg dose. Planned for as needed follow-up.  On 08/27/2022 he was seen by Dr. [name] where he complained of dizziness and intermittent chest tightness. EKG was ordered. Provided with prn meclizine and reviewed Epley maneuvers.  Today, he states he is feeling fair. In the office his blood pressure is 154/86 initially, and 134/76 on manual recheck.  However, about 6 months ago he noticed the onset of new  issues. This includes intermittent chest tightness that only occurs with exertion, usually during more strenuous activities such as lifting heavy storage tubs to the attic. Has occurred while mowing the lawn, but not every time.   Additionally he complains of "a  little fluttering in his chest", which may occur randomly or while sitting. Usually drinks a large mug of coffee around 2 PM. Discussed caffeine intake in moderation.  He has episodes of mild dizziness, as well as rare nausea that he believes might be related to the dizziness. Usually his dizziness occurs for a few seconds, up to 30 seconds at most. No loss of consciousness.  Of note, he has recently retired.  He denies any shortness of breath, peripheral edema, headaches, orthopnea, or PND.  ROS:  Please see the history of present illness. ROS otherwise negative except as noted.  (+) Chest tightness (+) Fluttering palpitations (+) Dizziness (+) Nausea  Studies Reviewed: Marland Kitchen    EKG Interpretation Date/Time:  Tuesday November 02 2022 08:32:31 EDT Ventricular Rate:  46 PR Interval:  218 QRS Duration:  90 QT Interval:  458 QTC Calculation: 400 R Axis:   11  Text Interpretation: Sinus bradycardia with 1st degree A-V block Confirmed by Jodelle Red [phone]) on 11/02/2022 8:38:35 AM    Physical Exam:    VS:  BP 134/76 (BP Location: Right Arm, Patient Position: Sitting, Cuff Size: Normal)   Pulse (!) 46   Ht 5\' 9"  (1.753 m)   Wt 180 lb 14.4 oz (82.1 kg)   BMI 26.71 kg/m    Wt Readings from Last 3 Encounters:  11/02/22 180 lb 14.4 oz (82.1 kg)  07/08/21 183 lb 2 oz (83.1 kg)  05/07/21 182 lb (82.6 kg)    GEN: Well nourished, well developed in no acute distress HEENT: Normal, moist mucous membranes NECK: No JVD CARDIAC: regular rhythm, normal S1 and S2, no rubs or gallops. No murmur. VASCULAR: Radial and DP pulses 2+ bilaterally. No carotid bruits RESPIRATORY:  Clear to auscultation without rales, wheezing or rhonchi  ABDOMEN: Soft, non-tender, non-distended MUSCULOSKELETAL:  Ambulates independently SKIN: Warm and dry, no edema NEUROLOGIC:  Alert and oriented x 3. No focal neuro deficits noted. PSYCHIATRIC:  Normal affect   ASSESSMENT AND PLAN: .    Chest  pain: -discussed treadmill stress, nuclear stress/lexiscan, and CT coronary angiography. Discussed pros and cons of each, including but not limited to false positive/false negative risk, radiation risk, and risk of IV contrast dye. Based on shared decision making, decision was made to pursue CT coronary angiography. -resting bradycardia, will not premedicate -counseled on need to get BMET prior to test -counseled on use of sublingual nitroglycerin and its importance to a good test -reviewed red flag warning signs that need immediate medical attention   Dizziness: -improving   Hypertension: -at goal today, continue losartan   Abnormal ECG: -borderline IVCD, no high risk features   Cigar smoking: -we did discuss that there is CV risk with this. He is not interested in quitting at this time.   Cardiac risk counseling and prevention recommendations: -recommend heart healthy/Mediterranean diet, with whole grains, fruits, vegetable, fish, lean meats, nuts, and olive oil. Limit salt. -recommend moderate walking, 3-5 times/week for 30-50 minutes each session. Aim for at least 150 minutes.week. Goal should be pace of 3 miles/hours, or walking 1.5 miles in 30 minutes -recommend avoidance of tobacco products. Avoid excess alcohol. The 10-year ASCVD risk score (Arnett DK, et al., 2019) is: 24.7%   Values used to calculate  the score:     Age: 76 years     Sex: Male     Is Non-Hispanic African American: No     Diabetic: No     Tobacco smoker: Yes     Systolic Blood Pressure: 134 mmHg     Is BP treated: Yes     HDL Cholesterol: 47.1 mg/dL     Total Cholesterol: 140 mg/dL    He is on aspirin per personal preference. He does not meet guidelines for routine aspirin for primary prevention, but if he wishes to continue I would consider dropping to 81 mg dose.  Dispo: Follow-up in 6 months, or sooner as needed.  I,Mathew Stumpf,acting as a Neurosurgeon for [employer], MD.,have documented all  relevant documentation on the behalf of Jodelle Red, MD,as directed by  Jodelle Red, MD while in the presence of Jodelle Red, MD.  I, Jodelle Red, MD, have reviewed all documentation for this visit. The documentation on 11/02/22 for the exam, diagnosis, procedures, and orders are all accurate and complete.   Signed, Jodelle Red, MD

## 2022-12-03 ENCOUNTER — Telehealth (HOSPITAL_COMMUNITY): Payer: Self-pay | Admitting: *Deleted

## 2022-12-03 NOTE — Telephone Encounter (Signed)
Patient called to say that he does not want to get his cardiac CT done at this time because he has not met his deductible for the year.  This test will cost him around $3000 out of pocket. He cannot afford this at time. He spoke with his PCP and will do alternative testing first to see if those things are abnormal before proceeding.  He states he will call back if he wishes to do the CCTA. Informed him I will let Dr. Cristal Deer know.  Larey Brick RN Navigator Cardiac Imaging [facility] [phone] Office [phone] Cell

## 2022-12-20 ENCOUNTER — Encounter (HOSPITAL_COMMUNITY): Payer: Self-pay

## 2022-12-30 ENCOUNTER — Encounter (HOSPITAL_BASED_OUTPATIENT_CLINIC_OR_DEPARTMENT_OTHER): Payer: Self-pay

## 2022-12-31 ENCOUNTER — Encounter (HOSPITAL_BASED_OUTPATIENT_CLINIC_OR_DEPARTMENT_OTHER): Payer: Self-pay

## 2023-01-10 ENCOUNTER — Encounter (HOSPITAL_COMMUNITY): Payer: Self-pay

## 2023-01-12 ENCOUNTER — Telehealth (HOSPITAL_COMMUNITY): Payer: Self-pay | Admitting: *Deleted

## 2023-01-12 NOTE — Telephone Encounter (Signed)
Reaching out to patient to offer assistance regarding upcoming cardiac imaging study; pt verbalizes understanding of appt date/time, parking situation and where to check in, pre-test NPO status and medications ordered, and verified current allergies; name and call back number provided for further questions should they arise Hayley Sharpe RN Navigator Cardiac Imaging [facility] [phone] office [phone] cell  

## 2023-01-12 NOTE — Telephone Encounter (Signed)
Attempted to call patient regarding upcoming cardiac CT appointment. Left message on voicemail with name and callback number Hayley Sharpe RN Navigator Cardiac Imaging [facility] [phone] Office   

## 2023-01-13 ENCOUNTER — Ambulatory Visit (HOSPITAL_COMMUNITY)
Admission: RE | Admit: 2023-01-13 | Discharge: 2023-01-13 | Disposition: A | Discharge status: Home / Self Care | Payer: Medicare Other | Source: Ambulatory Visit | Attending: Cardiology | Admitting: Cardiology

## 2023-01-13 DIAGNOSIS — R002 Palpitations: Secondary | ICD-10-CM | POA: Insufficient documentation

## 2023-01-13 DIAGNOSIS — R0789 Other chest pain: Secondary | ICD-10-CM | POA: Insufficient documentation

## 2023-01-13 DIAGNOSIS — R079 Chest pain, unspecified: Secondary | ICD-10-CM | POA: Diagnosis present

## 2023-01-13 DIAGNOSIS — R072 Precordial pain: Secondary | ICD-10-CM | POA: Insufficient documentation

## 2023-01-13 DIAGNOSIS — I10 Essential (primary) hypertension: Secondary | ICD-10-CM | POA: Insufficient documentation

## 2023-01-13 MED ORDER — IOHEXOL 350 MG/ML SOLN
100.0000 mL | Freq: Once | INTRAVENOUS | Status: AC | PRN
  Administered 2023-01-13: 100 mL via INTRAVENOUS

## 2023-01-13 MED ORDER — NITROGLYCERIN 0.4 MG SL SUBL
0.8000 mg | SUBLINGUAL_TABLET | Freq: Once | SUBLINGUAL | Status: AC
  Administered 2023-01-13: 0.8 mg via SUBLINGUAL

## 2023-01-13 MED ORDER — NITROGLYCERIN 0.4 MG SL SUBL
SUBLINGUAL_TABLET | SUBLINGUAL | Status: AC
  Filled 2023-01-13: qty 2

## 2023-01-13 NOTE — Progress Notes (Signed)
Denies chest pain and SOB, some dizziness on and off. Heart rate 47-62 with frequent PAC's. Dr Duke Salvia called and made aware of irregular heart rate. She stated patient is to be scanned retrospectively. Aware to drink a lot of water for rest of day.

## 2023-01-13 NOTE — Progress Notes (Signed)
Heart rate regular at 50ish when on CT table and therefore not scanned retrosopective.

## 2023-01-14 ENCOUNTER — Encounter (HOSPITAL_BASED_OUTPATIENT_CLINIC_OR_DEPARTMENT_OTHER): Payer: Self-pay

## 2023-01-19 ENCOUNTER — Encounter (HOSPITAL_BASED_OUTPATIENT_CLINIC_OR_DEPARTMENT_OTHER): Payer: Self-pay

## 2023-01-19 NOTE — Telephone Encounter (Signed)
-----   Message from [facility] sent at 01/19/2023 10:48 AM EST ----- CT shows mild coronary artery narrowing but significant plaque in the vessels. It will be important to keep cholesterol well controlled and optimize all other risk factors (tobacco, diet, blood pressure, activity, etc). He will be due for cholesterol labs in January with his PCP. Please have him follow up with me in February so we can review his results and determine if we need to change medical therapy.

## 2023-01-19 NOTE — Telephone Encounter (Signed)
Left message for patient to call back  

## 2023-04-06 ENCOUNTER — Encounter (HOSPITAL_BASED_OUTPATIENT_CLINIC_OR_DEPARTMENT_OTHER): Payer: Self-pay

## 2023-04-15 ENCOUNTER — Encounter (HOSPITAL_BASED_OUTPATIENT_CLINIC_OR_DEPARTMENT_OTHER): Payer: Self-pay

## 2023-04-18 ENCOUNTER — Encounter (HOSPITAL_BASED_OUTPATIENT_CLINIC_OR_DEPARTMENT_OTHER): Payer: Self-pay | Admitting: Cardiology

## 2023-04-18 ENCOUNTER — Ambulatory Visit (HOSPITAL_BASED_OUTPATIENT_CLINIC_OR_DEPARTMENT_OTHER): Payer: Medicare Other | Admitting: Cardiology

## 2023-04-18 VITALS — BP 132/78 | HR 50 | Ht 69.0 in | Wt 178.4 lb

## 2023-04-18 DIAGNOSIS — Z7189 Other specified counseling: Secondary | ICD-10-CM

## 2023-04-18 DIAGNOSIS — I10 Essential (primary) hypertension: Secondary | ICD-10-CM

## 2023-04-18 DIAGNOSIS — Z712 Person consulting for explanation of examination or test findings: Secondary | ICD-10-CM

## 2023-04-18 DIAGNOSIS — E7800 Pure hypercholesterolemia, unspecified: Secondary | ICD-10-CM | POA: Diagnosis not present

## 2023-04-18 DIAGNOSIS — I2510 Atherosclerotic heart disease of native coronary artery without angina pectoris: Secondary | ICD-10-CM

## 2023-04-18 MED ORDER — ASPIRIN 81 MG PO TBEC: 81.0000 mg | DELAYED_RELEASE_TABLET | Freq: Every day | ORAL | Status: AC

## 2023-04-18 NOTE — Patient Instructions (Signed)
 Medication Instructions:  Your physician recommends that you continue on your current medications as directed. Please refer to the Current Medication list given to you today.  *If you need a refill on your cardiac medications before your next appointment, please call your pharmacy*   Follow-Up: At [facility], you and your health needs are our priority.  As part of our continuing mission to provide you with exceptional heart care, we have created designated Provider Care Teams.  These Care Teams include your primary Cardiologist (physician) and Advanced Practice Providers (APPs -  Physician Assistants and Nurse Practitioners) who all work together to provide you with the care you need, when you need it.  We recommend signing up for the patient portal called "MyChart".  Sign up information is provided on this After Visit Summary.  MyChart is used to connect with patients for Virtual Visits (Telemedicine).  Patients are able to view lab/test results, encounter notes, upcoming appointments, etc.  Non-urgent messages can be sent to your provider as well.   To learn more about what you can do with MyChart, go to [email or web address].     Your next appointment:   12 month(s)  Provider:   Jodelle Red, MD

## 2023-04-18 NOTE — Progress Notes (Signed)
Cardiology Office Note:  .    Date:  04/18/2023  ID:  Daniel Lucero, DOB [birth year], MRN [id number] PCP: Gaspar Garbe, MD  [facility] Providers Cardiologist:  Jodelle Red, MD     History of Present Illness: .    Daniel Lucero is a 73 y.o. male with a hx of squamous cell carcinoma of skin, nephrolithiasis, and depression, who is seen for follow-up today. He was initially seen 04/27/2021 as a new consult at the request of [name], NP for the evaluation and management of dizziness, sinus bradycardia, and abnormal EKG.   CV history: CCTA 12/2022 with Ca score 282 (60th %ile), TPV 311 (23rd %ile), mild nonobstructive plaque in LAD, aortic atherosclerosis, aortic valve calcification, 4.2 cm ascending aorta.   Comorbid conditions: Hypertension, prior cigarette smoker and current cigar smoker. His father died of hypertension at 62 yo.  Today: Reviewed coronary CT results. Sharp pains in his chest are much less frequent and more mld. Walks a lot, has some shortness of breath going up hills. If he slows down/stops, he is good to go again in about 30 seconds.  Tolerating rosuvastatin 20 mg daily, LDL well controlled at 38. Discussed goals today. Discussed recommended aspirin dose is 81 mg daily.  Having night sweats, lost 6 lbs since Covid. Taking prednisone and doxycycline right now for sinus issues.  Feels occasional skipped beats. No flutters. Has considered Kardia. Discussed monitor if symptoms worsen.  Denies shortness of breath at rest. No PND, orthopnea, LE edema or unexpected weight gain. No syncope. ROS otherwise negative except as noted.   ROS:  Please see the history of present illness. ROS otherwise negative except as noted.   Studies Reviewed: Marland Kitchen         Physical Exam:    VS:  BP 132/78 (BP Location: Left Arm, Patient Position: Sitting)   Pulse (!) 50   Ht 5\' 9"  (1.753 m)   Wt 178 lb 6.4 oz (80.9 kg)   SpO2 98%   BMI 26.35 kg/m     Wt Readings from Last 3 Encounters:  04/18/23 178 lb 6.4 oz (80.9 kg)  11/02/22 180 lb 14.4 oz (82.1 kg)  07/08/21 183 lb 2 oz (83.1 kg)    GEN: Well nourished, well developed in no acute distress HEENT: Normal, moist mucous membranes NECK: No JVD CARDIAC: regular rhythm, normal S1 and S2, no rubs or gallops. No murmur. VASCULAR: Radial and DP pulses 2+ bilaterally. No carotid bruits RESPIRATORY:  Clear to auscultation without rales, wheezing or rhonchi  ABDOMEN: Soft, non-tender, non-distended MUSCULOSKELETAL:  Ambulates independently SKIN: Warm and dry, no edema NEUROLOGIC:  Alert and oriented x 3. No focal neuro deficits noted. PSYCHIATRIC:  Normal affect   ASSESSMENT AND PLAN: .    Nonobstructive CAD Hypercholesterolemia -reviewed his CT coronary results -on rosuvastatin 20 mg, tolerating. LDL 38 -not currently taking aspirin. Discussed recommendations, he will trial and watch for bleeding -reviewed red flag warning signs that need immediate medical attention    Hypertension: -at goal today, continue losartan   Abnormal ECG: -borderline IVCD, no high risk features   Cigar smoking: -we did discuss that there is CV risk with this. He is not interested in quitting at this time.   Cardiac risk counseling and prevention recommendations: -recommend heart healthy/Mediterranean diet, with whole grains, fruits, vegetable, fish, lean meats, nuts, and olive oil. Limit salt. -recommend moderate walking, 3-5 times/week for 30-50 minutes each session. Aim for at least 150 minutes.week. Goal  should be pace of 3 miles/hours, or walking 1.5 miles in 30 minutes  Dispo: Follow-up in 12 months, or sooner as needed.  Signed, Jodelle Red, MD

## 2024-04-17 NOTE — Progress Notes (Signed)
 " Cardiology Office Note:  .    Date:  04/18/2024  ID:  Daniel Lucero, DOB February 13, 1951, MRN [id number] PCP: Vernadine Charlie ORN, MD  [facility] Providers Cardiologist:  Shelda Bruckner, MD     History of Present Illness: .    Daniel Lucero is a 74 y.o. male with a hx of squamous cell carcinoma of skin, nephrolithiasis, and depression, who is seen for follow-up today. He was initially seen 04/27/2021 as a new consult at the request of Kip Charlie, NP for the evaluation and management of dizziness, sinus bradycardia, and abnormal EKG.   CV history: CCTA 12/2022 with Ca score 282 (60th %ile), TPV 311 (23rd %ile), mild nonobstructive plaque in LAD, aortic atherosclerosis, aortic valve calcification, 4.2 cm ascending aorta.   Comorbid conditions: Hypertension, prior cigarette smoker and current cigar smoker. His father died of hypertension at 66 yo.  Today: Overall doing well. Fluttering is now infrequent, once every few months.   Typically goes to the gym 3x/week and walks the dog, though the weather recently has hindered this.  Gets lightheaded with bending over. Wants to cut back on medications. Hoping to cut back on tamsulosin  now that he is on finasteride. We discussed how these medications can cause orthostasis. Checks blood pressure intermittently at home. Blood pressures have been average 120s/70s-80s.  ROS:  Denies chest pain, shortness of breath at rest or with normal exertion. No PND, orthopnea, LE edema or unexpected weight gain. No syncope. ROS otherwise negative except as noted.   Studies Reviewed: SABRA    EKG Interpretation Date/Time:  Wednesday April 18 2024 08:07:05 EST Ventricular Rate:  49 PR Interval:  212 QRS Duration:  88 QT Interval:  454 QTC Calculation: 410 R Axis:   19  Text Interpretation: Sinus bradycardia with sinus arrhythmia with 1st degree A-V block When compared with ECG of 02-Nov-2022 08:32, No significant change was found  Confirmed by Bruckner Shelda [phone]) on 04/18/2024 8:14:16 AM    Physical Exam:    VS:  BP 128/82 (BP Location: Right Arm, Patient Position: Sitting, Cuff Size: Normal)   Pulse (!) 49   Ht 5' 9 (1.753 m)   Wt 179 lb 11.2 oz (81.5 kg)   SpO2 98%   BMI 26.54 kg/m    Wt Readings from Last 3 Encounters:  04/18/24 179 lb 11.2 oz (81.5 kg)  04/18/23 178 lb 6.4 oz (80.9 kg)  11/02/22 180 lb 14.4 oz (82.1 kg)    GEN: Well nourished, well developed in no acute distress HEENT: Normal, moist mucous membranes NECK: No JVD CARDIAC: regular rhythm, normal S1 and S2, no rubs or gallops. No murmur. VASCULAR: Radial and DP pulses 2+ bilaterally. No carotid bruits RESPIRATORY:  Clear to auscultation without rales, wheezing or rhonchi  ABDOMEN: Soft, non-tender, non-distended MUSCULOSKELETAL:  Ambulates independently SKIN: Warm and dry, no edema NEUROLOGIC:  Alert and oriented x 3. No focal neuro deficits noted. PSYCHIATRIC:  Normal affect   ASSESSMENT AND PLAN: .    Nonobstructive CAD Hypercholesterolemia -CT coronary from 2024 as above -on rosuvastatin 20 mg, tolerating. LDL 38 -we discussed that our guidelines recommended LDL <70, lower is better. He wants to try cutting back to 10 mg dose and then recheck at his follow up with Dr. Tisovec. We discussed pros/cons of this. -no bleeding issues when he takes aspirin  but prefers to avoid medications -reviewed red flag warning signs that need immediate medical attention    Hypertension: -at goal today, continue losartan  50  mg daily, discussed ideal range <130/80 -he is concerned about lightheadedness with bending over. Discussed today. Suspect that the tamsulosin /finasteride combination may be exacerbating this   Cigar smoking: -we did discuss that there is CV risk with this. He is not interested in quitting at this time.   Borderline abnormal ECG: sinus arrhythmia is a sign of a healthy heart. Has had borderline IVCD and 1st degree  block. No high risk features. No further workup required.  Cardiac risk counseling and prevention recommendations: -recommend heart healthy/Mediterranean diet, with whole grains, fruits, vegetable, fish, lean meats, nuts, and olive oil. Limit salt. -recommend moderate walking, 3-5 times/week for 30-50 minutes each session. Aim for at least 150 minutes.week. Goal should be pace of 3 miles/hours, or walking 1.5 miles in 30 minutes  Dispo: I offered PRN follow up, but he would prefer to be seen once a year.  Signed, Shelda Bruckner, MD   "

## 2024-04-18 ENCOUNTER — Encounter (HOSPITAL_BASED_OUTPATIENT_CLINIC_OR_DEPARTMENT_OTHER): Payer: Self-pay | Admitting: Cardiology

## 2024-04-18 ENCOUNTER — Encounter (HOSPITAL_BASED_OUTPATIENT_CLINIC_OR_DEPARTMENT_OTHER): Payer: Self-pay

## 2024-04-18 ENCOUNTER — Ambulatory Visit (INDEPENDENT_AMBULATORY_CARE_PROVIDER_SITE_OTHER): Admitting: Cardiology

## 2024-04-18 VITALS — BP 128/82 | HR 49 | Ht 69.0 in | Wt 179.7 lb

## 2024-04-18 DIAGNOSIS — Z7189 Other specified counseling: Secondary | ICD-10-CM

## 2024-04-18 DIAGNOSIS — E7800 Pure hypercholesterolemia, unspecified: Secondary | ICD-10-CM

## 2024-04-18 DIAGNOSIS — I2510 Atherosclerotic heart disease of native coronary artery without angina pectoris: Secondary | ICD-10-CM | POA: Diagnosis not present

## 2024-04-18 DIAGNOSIS — I10 Essential (primary) hypertension: Secondary | ICD-10-CM

## 2024-04-18 NOTE — Patient Instructions (Signed)
 Medication Instructions:  No changes *If you need a refill on your cardiac medications before your next appointment, please call your pharmacy*  Lab Work: none  Testing/Procedures: none  Follow-Up: At [facility], you and your health needs are our priority.  As part of our continuing mission to provide you with exceptional heart care, our providers are all part of one team.  This team includes your primary Cardiologist (physician) and Advanced Practice Providers or APPs (Physician Assistants and Nurse Practitioners) who all work together to provide you with the care you need, when you need it.  Your next appointment:   12 month(s)  Provider:   Rosaline Bane, NP or Reche Finder, NP
